# Patient Record
Sex: Male | Born: 1980 | Race: Black or African American | Hispanic: No | Marital: Married | Smoking: Current every day smoker
Health system: Southern US, Community
[De-identification: ages and names within clinical notes are randomized; demographics above are authoritative.]

## PROBLEM LIST (undated history)

## (undated) DIAGNOSIS — E119 Type 2 diabetes mellitus without complications: Secondary | ICD-10-CM

## (undated) DIAGNOSIS — I10 Essential (primary) hypertension: Secondary | ICD-10-CM

## (undated) DIAGNOSIS — R202 Paresthesia of skin: Secondary | ICD-10-CM

## (undated) DIAGNOSIS — R200 Anesthesia of skin: Secondary | ICD-10-CM

## (undated) DIAGNOSIS — F909 Attention-deficit hyperactivity disorder, unspecified type: Secondary | ICD-10-CM

## (undated) HISTORY — PX: NO PAST SURGERIES: SHX2092

---

## 2012-07-21 ENCOUNTER — Encounter (HOSPITAL_COMMUNITY): Payer: Self-pay | Admitting: Emergency Medicine

## 2012-07-21 ENCOUNTER — Inpatient Hospital Stay (HOSPITAL_COMMUNITY): Payer: Self-pay

## 2012-07-21 ENCOUNTER — Observation Stay (HOSPITAL_COMMUNITY)
Admission: EM | Admit: 2012-07-21 | Discharge: 2012-07-22 | Disposition: A | Discharge status: Home / Self Care | Payer: 59 | Attending: Internal Medicine | Admitting: Internal Medicine

## 2012-07-21 DIAGNOSIS — R030 Elevated blood-pressure reading, without diagnosis of hypertension: Secondary | ICD-10-CM | POA: Insufficient documentation

## 2012-07-21 DIAGNOSIS — R0789 Other chest pain: Secondary | ICD-10-CM | POA: Insufficient documentation

## 2012-07-21 DIAGNOSIS — R7309 Other abnormal glucose: Secondary | ICD-10-CM

## 2012-07-21 DIAGNOSIS — R739 Hyperglycemia, unspecified: Secondary | ICD-10-CM | POA: Diagnosis present

## 2012-07-21 DIAGNOSIS — Z720 Tobacco use: Secondary | ICD-10-CM | POA: Diagnosis present

## 2012-07-21 DIAGNOSIS — E119 Type 2 diabetes mellitus without complications: Secondary | ICD-10-CM

## 2012-07-21 DIAGNOSIS — F17200 Nicotine dependence, unspecified, uncomplicated: Secondary | ICD-10-CM

## 2012-07-21 DIAGNOSIS — IMO0001 Reserved for inherently not codable concepts without codable children: Secondary | ICD-10-CM | POA: Diagnosis present

## 2012-07-21 DIAGNOSIS — R209 Unspecified disturbances of skin sensation: Principal | ICD-10-CM | POA: Insufficient documentation

## 2012-07-21 DIAGNOSIS — R202 Paresthesia of skin: Secondary | ICD-10-CM | POA: Diagnosis present

## 2012-07-21 DIAGNOSIS — G459 Transient cerebral ischemic attack, unspecified: Secondary | ICD-10-CM

## 2012-07-21 DIAGNOSIS — R200 Anesthesia of skin: Secondary | ICD-10-CM | POA: Diagnosis present

## 2012-07-21 DIAGNOSIS — I517 Cardiomegaly: Secondary | ICD-10-CM | POA: Diagnosis present

## 2012-07-21 HISTORY — DX: Anesthesia of skin: R20.0

## 2012-07-21 HISTORY — DX: Anesthesia of skin: R20.2

## 2012-07-21 HISTORY — DX: Paresthesia of skin: R20.2

## 2012-07-21 HISTORY — DX: Attention-deficit hyperactivity disorder, unspecified type: F90.9

## 2012-07-21 LAB — CBC WITH DIFFERENTIAL/PLATELET
Basophils Absolute: 0.1 10*3/uL (ref 0.0–0.1)
HCT: 45.8 % (ref 39.0–52.0)
Hemoglobin: 16.2 g/dL (ref 13.0–17.0)
Lymphocytes Relative: 31 % (ref 12–46)
Monocytes Absolute: 0.7 10*3/uL (ref 0.1–1.0)
Monocytes Relative: 6 % (ref 3–12)
Neutro Abs: 6.9 10*3/uL (ref 1.7–7.7)
Neutrophils Relative %: 61 % (ref 43–77)
WBC: 11.4 10*3/uL — ABNORMAL HIGH (ref 4.0–10.5)

## 2012-07-21 LAB — URINALYSIS, ROUTINE W REFLEX MICROSCOPIC
Glucose, UA: >1000 mg/dL — AB
Ketones, ur: NEGATIVE mg/dL
Leukocytes, UA: NEGATIVE
Nitrite: NEGATIVE
Specific Gravity, Urine: 1.042 — ABNORMAL HIGH (ref 1.005–1.030)
pH: 6 (ref 5.0–8.0)

## 2012-07-21 LAB — URINE MICROSCOPIC-ADD ON

## 2012-07-21 LAB — BASIC METABOLIC PANEL
BUN: 8 mg/dL (ref 6–23)
CO2: 26 mEq/L (ref 19–32)
Chloride: 96 mEq/L (ref 96–112)
Creatinine, Ser: 0.74 mg/dL (ref 0.50–1.35)
Potassium: 4.3 mEq/L (ref 3.5–5.1)

## 2012-07-21 LAB — GLUCOSE, CAPILLARY: Glucose-Capillary: 356 mg/dL — ABNORMAL HIGH (ref 70–99)

## 2012-07-21 MED ORDER — SODIUM CHLORIDE 0.9 % IV SOLN
Freq: Once | INTRAVENOUS | Status: AC
  Administered 2012-07-21: 21:00:00 via INTRAVENOUS

## 2012-07-21 MED ORDER — SODIUM CHLORIDE 0.9 % IV BOLUS (SEPSIS)
1000.0000 mL | Freq: Once | INTRAVENOUS | Status: AC
  Administered 2012-07-21: 1000 mL via INTRAVENOUS

## 2012-07-21 MED ORDER — ASPIRIN 81 MG PO CHEW
162.0000 mg | CHEWABLE_TABLET | Freq: Once | ORAL | Status: AC
  Administered 2012-07-21: 162 mg via ORAL
  Filled 2012-07-21: qty 2

## 2012-07-21 MED ORDER — INSULIN ASPART 100 UNIT/ML ~~LOC~~ SOLN
0.0000 [IU] | Freq: Three times a day (TID) | SUBCUTANEOUS | Status: DC
  Administered 2012-07-22: 1 [IU] via SUBCUTANEOUS
  Administered 2012-07-22: 8 [IU] via SUBCUTANEOUS
  Administered 2012-07-22: 5 [IU] via SUBCUTANEOUS

## 2012-07-21 MED ORDER — INSULIN ASPART 100 UNIT/ML ~~LOC~~ SOLN
5.0000 [IU] | Freq: Once | SUBCUTANEOUS | Status: AC
  Administered 2012-07-21: 5 [IU] via SUBCUTANEOUS
  Filled 2012-07-21: qty 1

## 2012-07-21 NOTE — ED Notes (Signed)
Pt c/o numbness in right hand upon waking this am; pt sts increase in thirst and urinating x several month; pt wants to be checked for DM

## 2012-07-21 NOTE — ED Notes (Signed)
Pt. Reports waking up with numbness/tingling in right hand. States he went to work, but numbness did not subside so he came to the ED. States does not feel numbness currently. Radial pulse strong and cap refill instant.

## 2012-07-21 NOTE — Progress Notes (Signed)
Patient admitted to floor from ED. Patient A&Ox3. MD to floor to write orders on patient. Dr. Eilene Ghazi stated trying to r/o TIA. Explained to patient that he would need to go to neuro floor. Waiting for bed assignment.Will continue to monitor Nelda Marseille, RN

## 2012-07-21 NOTE — ED Notes (Signed)
Wait time discussed 

## 2012-07-21 NOTE — ED Notes (Signed)
The pts sugar remains high.  Lab has been called to draw this pts blood

## 2012-07-21 NOTE — H&P (Signed)
Johnathan Stafford is an 31 y.o. male.   Patient was seen and examined on July 21, 2012 at 11:53 PM. PCP - none. Chief Complaint: Right hand finger numbness.  HPI:  31 year old male with no significant past medical history presented to the ER with complaints of persistent numbness in his fingers the right hand. The numbness started off waking up in the morning and persisted through the day. Since it was not getting better he came to the ER. Patient did not have any focal deficits, blurred vision, difficulty speaking or swallowing. Over the last few days patient has noticed off and on dizzy spells and increasing urination. In the ER CT head was negative. Patient's blood sugar was found to be high but not in DKA. Patient has been admitted for further management. Patient at this time denies any chest pain or shortness of breath nausea vomiting abdominal pain. 3-4 days ago patient had a small pin pricking sensation in his right side of chest which happened 2 or 3 times.  Past Medical History  Diagnosis Date  . No pertinent past medical history     Past Surgical History  Procedure Date  . No past surgeries     Family History  Problem Relation Age of Onset  . Diabetes type II Mother   . Coronary artery disease Mother   . Hyperthyroidism Father    Social History:  reports that he has been smoking.  He does not have any smokeless tobacco history on file. He reports that he drinks alcohol. He reports that he does not use illicit drugs.  Allergies: No Known Allergies  No prescriptions prior to admission    Results for orders placed during the hospital encounter of 07/21/12 (from the past 48 hour(s))  GLUCOSE, CAPILLARY     Status: Abnormal   Collection Time   07/21/12  1:40 PM      Component Value Range Comment   Glucose-Capillary 436 (*) 70 - 99 mg/dL    Comment 1 Notify RN      Comment 2 Documented in Chart     GLUCOSE, CAPILLARY     Status: Abnormal   Collection Time   07/21/12  6:51 PM    Component Value Range Comment   Glucose-Capillary 304 (*) 70 - 99 mg/dL   CBC WITH DIFFERENTIAL     Status: Abnormal   Collection Time   07/21/12  7:06 PM      Component Value Range Comment   WBC 11.4 (*) 4.0 - 10.5 K/uL    RBC 4.82  4.22 - 5.81 MIL/uL    Hemoglobin 16.2  13.0 - 17.0 g/dL    HCT 40.9  81.1 - 91.4 %    MCV 95.0  78.0 - 100.0 fL    MCH 33.6  26.0 - 34.0 pg    MCHC 35.4  30.0 - 36.0 g/dL    RDW 78.2  95.6 - 21.3 %    Platelets 262  150 - 400 K/uL    Neutrophils Relative 61  43 - 77 %    Neutro Abs 6.9  1.7 - 7.7 K/uL    Lymphocytes Relative 31  12 - 46 %    Lymphs Abs 3.5  0.7 - 4.0 K/uL    Monocytes Relative 6  3 - 12 %    Monocytes Absolute 0.7  0.1 - 1.0 K/uL    Eosinophils Relative 2  0 - 5 %    Eosinophils Absolute 0.2  0.0 - 0.7 K/uL  Basophils Relative 1  0 - 1 %    Basophils Absolute 0.1  0.0 - 0.1 K/uL   BASIC METABOLIC PANEL     Status: Abnormal   Collection Time   07/21/12  7:06 PM      Component Value Range Comment   Sodium 133 (*) 135 - 145 mEq/L    Potassium 4.3  3.5 - 5.1 mEq/L HEMOLYSIS AT THIS LEVEL MAY AFFECT RESULT   Chloride 96  96 - 112 mEq/L    CO2 26  19 - 32 mEq/L    Glucose, Bld 365 (*) 70 - 99 mg/dL    BUN 8  6 - 23 mg/dL    Creatinine, Ser 1.61  0.50 - 1.35 mg/dL    Calcium 9.7  8.4 - 09.6 mg/dL    GFR calc non Af Amer >90  >90 mL/min    GFR calc Af Amer >90  >90 mL/min   URINALYSIS, ROUTINE W REFLEX MICROSCOPIC     Status: Abnormal   Collection Time   07/21/12  7:47 PM      Component Value Range Comment   Color, Urine YELLOW  YELLOW    APPearance CLEAR  CLEAR    Specific Gravity, Urine 1.042 (*) 1.005 - 1.030    pH 6.0  5.0 - 8.0    Glucose, UA >1000 (*) NEGATIVE mg/dL    Hgb urine dipstick NEGATIVE  NEGATIVE    Bilirubin Urine NEGATIVE  NEGATIVE    Ketones, ur NEGATIVE  NEGATIVE mg/dL    Protein, ur NEGATIVE  NEGATIVE mg/dL    Urobilinogen, UA 1.0  0.0 - 1.0 mg/dL    Nitrite NEGATIVE  NEGATIVE    Leukocytes, UA NEGATIVE   NEGATIVE   URINE MICROSCOPIC-ADD ON     Status: Normal   Collection Time   07/21/12  7:47 PM      Component Value Range Comment   Squamous Epithelial / LPF RARE  RARE    RBC / HPF 0-2  <3 RBC/hpf   GLUCOSE, CAPILLARY     Status: Abnormal   Collection Time   07/21/12  9:23 PM      Component Value Range Comment   Glucose-Capillary 356 (*) 70 - 99 mg/dL   GLUCOSE, CAPILLARY     Status: Abnormal   Collection Time   07/21/12 11:14 PM      Component Value Range Comment   Glucose-Capillary 222 (*) 70 - 99 mg/dL    Ct Head Wo Contrast  07/21/2012  *RADIOLOGY REPORT*  Clinical Data: Right hand numbness.  CT HEAD WITHOUT CONTRAST  Technique:  Contiguous axial images were obtained from the base of the skull through the vertex without contrast.  Comparison: None.  Findings: The ventricles and sulci are symmetrical without significant effacement, displacement, or dilatation. No mass effect or midline shift. No abnormal extra-axial fluid collections. The grey-white matter junction is distinct. Basal cisterns are not effaced. No acute intracranial hemorrhage. No depressed skull fractures.  Visualized paranasal sinuses and mastoid air cells are not opacified.  IMPRESSION: No acute intracranial abnormalities.  Original Report Authenticated By: Marlon Pel, M.D.    Review of Systems  Constitutional: Negative.   HENT: Negative.   Eyes: Negative.   Respiratory: Negative.   Cardiovascular: Negative.   Gastrointestinal: Negative.   Genitourinary: Negative.   Musculoskeletal: Negative.   Skin: Negative.   Neurological: Positive for dizziness and tingling (and numbness of right four fingers.).  Endo/Heme/Allergies: Negative.   Psychiatric/Behavioral: Negative.  Blood pressure 141/92, pulse 76, temperature 98.1 F (36.7 C), temperature source Oral, resp. rate 16, height 6\' 1"  (1.854 m), weight 92.9 kg (204 lb 12.9 oz), SpO2 95.00%. Physical Exam  Constitutional: He is oriented to person, place, and  time. He appears well-developed and well-nourished. No distress.  HENT:  Head: Normocephalic and atraumatic.  Right Ear: External ear normal.  Left Ear: External ear normal.  Nose: Nose normal.  Mouth/Throat: Oropharynx is clear and moist. No oropharyngeal exudate.  Eyes: Conjunctivae are normal. Pupils are equal, round, and reactive to light. Right eye exhibits no discharge. Left eye exhibits no discharge. No scleral icterus.  Neck: Normal range of motion. Neck supple.  Cardiovascular: Normal rate and regular rhythm.   Respiratory: Effort normal and breath sounds normal. No respiratory distress. He has no wheezes. He has no rales.  GI: Soft. Bowel sounds are normal. He exhibits no distension. There is no tenderness. There is no rebound.  Musculoskeletal: Normal range of motion. He exhibits no edema and no tenderness.  Neurological: He is alert and oriented to person, place, and time.       Moves all extremities 5/5. No facial asymmetry. No tongue deviation.  Skin: Skin is warm and dry. He is not diaphoretic.  Psychiatric: His behavior is normal.     Assessment/Plan  #1. Right hand finger numbness - I discussed with on-call neurologist Dr. Eilleen Kempf. Dr.Parekh has requested to call them back in for consult if MRI is positive for any stroke. Patient will be placed on neurochecks, swallow evaluation, MRI MRA brain, carotid Doppler and 2-D echo has been ordered. #2. Hyperglycemia - given his high level of blood sugar patient probably has new onset diabetes mellitus2. At this time we will continue hydration and patient will be kept on sliding-scale coverage. Check hemoglobin A1c. Eventually patient may be discharged on metformin and creatinine is normal. #3. Atypical chest pain - present chest pain-free. Check cardiac markers, EKG and chest x-ray, 2-D echo. #4. Elevated blood pressure - closely follow blood pressure trends in the hospital to see if patient requests definite antihypertensives. #5.  Tobacco abuse - strongly advised to quit smoking.  CODE STATUS - full code.  Ardell Makarewicz N. 07/21/2012, 11:53 PM

## 2012-07-21 NOTE — ED Notes (Signed)
Pt advised of the wait.  He next to go back

## 2012-07-22 ENCOUNTER — Encounter (HOSPITAL_COMMUNITY): Payer: Self-pay | Admitting: General Practice

## 2012-07-22 ENCOUNTER — Observation Stay (HOSPITAL_COMMUNITY): Payer: Self-pay

## 2012-07-22 DIAGNOSIS — I517 Cardiomegaly: Secondary | ICD-10-CM

## 2012-07-22 DIAGNOSIS — E119 Type 2 diabetes mellitus without complications: Secondary | ICD-10-CM

## 2012-07-22 DIAGNOSIS — G459 Transient cerebral ischemic attack, unspecified: Secondary | ICD-10-CM

## 2012-07-22 LAB — HEMOGLOBIN A1C
Hgb A1c MFr Bld: 11.1 % — ABNORMAL HIGH (ref <5.7)
Mean Plasma Glucose: 272 mg/dL — ABNORMAL HIGH (ref <117)

## 2012-07-22 LAB — CARDIAC PANEL(CRET KIN+CKTOT+MB+TROPI)
Relative Index: 0.8 (ref 0.0–2.5)
Relative Index: 0.8 (ref 0.0–2.5)
Total CK: 170 U/L (ref 7–232)
Troponin I: <0.3 ng/mL (ref <0.30)

## 2012-07-22 LAB — CBC WITH DIFFERENTIAL/PLATELET
Basophils Absolute: 0 10*3/uL (ref 0.0–0.1)
Basophils Relative: 0 % (ref 0–1)
Eosinophils Relative: 2 % (ref 0–5)
Lymphocytes Relative: 34 % (ref 12–46)
MCHC: 33.7 g/dL (ref 30.0–36.0)
MCV: 96.3 fL (ref 78.0–100.0)
Platelets: 230 10*3/uL (ref 150–400)
RDW: 11.8 % (ref 11.5–15.5)
WBC: 9.9 10*3/uL (ref 4.0–10.5)

## 2012-07-22 LAB — COMPREHENSIVE METABOLIC PANEL
ALT: 46 U/L (ref 0–53)
AST: 24 U/L (ref 0–37)
Albumin: 3.4 g/dL — ABNORMAL LOW (ref 3.5–5.2)
Alkaline Phosphatase: 70 U/L (ref 39–117)
Potassium: 3.6 mEq/L (ref 3.5–5.1)
Sodium: 137 mEq/L (ref 135–145)
Total Protein: 6.4 g/dL (ref 6.0–8.3)

## 2012-07-22 LAB — RAPID URINE DRUG SCREEN, HOSP PERFORMED
Amphetamines: NOT DETECTED
Barbiturates: NOT DETECTED
Benzodiazepines: NOT DETECTED
Cocaine: NOT DETECTED

## 2012-07-22 LAB — GLUCOSE, CAPILLARY: Glucose-Capillary: 305 mg/dL — ABNORMAL HIGH (ref 70–99)

## 2012-07-22 LAB — LIPID PANEL
Total CHOL/HDL Ratio: 6.1 RATIO
VLDL: 38 mg/dL (ref 0–40)

## 2012-07-22 LAB — TSH: TSH: 0.877 u[IU]/mL (ref 0.350–4.500)

## 2012-07-22 MED ORDER — LIVING WELL WITH DIABETES BOOK
Freq: Once | Status: DC
  Filled 2012-07-22: qty 1

## 2012-07-22 MED ORDER — HYDRALAZINE HCL 20 MG/ML IJ SOLN
10.0000 mg | INTRAMUSCULAR | Status: DC | PRN
  Filled 2012-07-22: qty 0.5

## 2012-07-22 MED ORDER — LIVING WELL WITH DIABETES BOOK: 1.0000 | Freq: Once | Status: DC

## 2012-07-22 MED ORDER — ASPIRIN 325 MG PO TABS
325.0000 mg | ORAL_TABLET | Freq: Every day | ORAL | Status: DC
  Administered 2012-07-22: 325 mg via ORAL
  Filled 2012-07-22 (×2): qty 1

## 2012-07-22 MED ORDER — LISINOPRIL 10 MG PO TABS: 10.0000 mg | ORAL_TABLET | Freq: Every day | ORAL | Status: DC

## 2012-07-22 MED ORDER — METFORMIN HCL 500 MG PO TABS
500.0000 mg | ORAL_TABLET | Freq: Two times a day (BID) | ORAL | Status: DC
  Administered 2012-07-22: 500 mg via ORAL
  Filled 2012-07-22 (×3): qty 1

## 2012-07-22 MED ORDER — METFORMIN HCL 500 MG PO TABS: 500.0000 mg | ORAL_TABLET | Freq: Two times a day (BID) | ORAL | Status: DC

## 2012-07-22 MED ORDER — METFORMIN HCL 1000 MG PO TABS: 1000.0000 mg | ORAL_TABLET | Freq: Two times a day (BID) | ORAL | Status: DC

## 2012-07-22 MED ORDER — SODIUM CHLORIDE 0.9 % IV SOLN
INTRAVENOUS | Status: DC
  Administered 2012-07-22 (×2): via INTRAVENOUS

## 2012-07-22 NOTE — ED Provider Notes (Signed)
History     CSN: [id number]  Arrival date & time 07/21/12  1321   First MD Initiated Contact with Patient 07/21/12 1947      Chief Complaint  Patient presents with  . Numbness    (Consider location/radiation/quality/duration/timing/severity/associated sxs/prior treatment) HPI Comments: Pt with no medical hx comes in with cc of numbness in the right hand. Pt states that he woke up with the numbness, described as tingling, and the numbness never got better, so he came to the ED at 1 pm. numbness resolved while he was in the waiting room. Pt also noted to have high blood sugar. He has no hx of diabetes. Pt has no medical problems that are known and he has no hx of strokes, tia or any other neurologic deficits.  The history is provided by the patient.    Past Medical History  Diagnosis Date  . ADHD (attention deficit hyperactivity disorder)     "all the way up to 9th grade"  . Numbness and tingling in right hand 07/21/2012    resolved at 2355 07/21/12    Past Surgical History  Procedure Date  . No past surgeries     Family History  Problem Relation Age of Onset  . Diabetes type II Mother   . Coronary artery disease Mother   . Hyperthyroidism Father     History  Substance Use Topics  . Smoking status: Current Everyday Smoker -- 1.0 packs/day for 13 years    Types: Cigarettes  . Smokeless tobacco: Never Used  . Alcohol Use: Yes     07/21/12 "I do alcohol maybe twice a month; if that"      Review of Systems  Constitutional: Negative for activity change and appetite change.  Respiratory: Negative for cough and shortness of breath.   Cardiovascular: Negative for chest pain.  Gastrointestinal: Negative for abdominal pain.  Genitourinary: Negative for dysuria.    Allergies  Review of patient's allergies indicates no known allergies.  Home Medications  No current outpatient prescriptions on file.  BP 138/9  Pulse 64  Temp 98.1 F (36.7 C) (Oral)  Resp 18  Ht 6'  1" (1.854 m)  Wt 204 lb 12.9 oz (92.9 kg)  BMI 27.02 kg/m2  SpO2 98%  Physical Exam  Vitals reviewed. Constitutional: He is oriented to person, place, and time. He appears well-developed and well-nourished.  HENT:  Head: Normocephalic and atraumatic.  Eyes: Conjunctivae and EOM are normal. Pupils are equal, round, and reactive to light.  Neck: Normal range of motion. Neck supple. No JVD present.  Cardiovascular: Normal rate and regular rhythm.   Pulmonary/Chest: Effort normal and breath sounds normal. No respiratory distress. He has no wheezes.  Abdominal: Soft. Bowel sounds are normal. He exhibits no distension. There is no tenderness. There is no rebound and no guarding.  Neurological: He is alert and oriented to person, place, and time. He displays normal reflexes. No cranial nerve deficit. Coordination normal.       NIHSS - 0 No objective sensory deficits, Motor strength upper and lower extremity 4+ and equal Normal cerebellar exam  Skin: Skin is warm and dry.    ED Course  Procedures (including critical care time)  Labs Reviewed  GLUCOSE, CAPILLARY - Abnormal; Notable for the following:    Glucose-Capillary 436 (*)     All other components within normal limits  CBC WITH DIFFERENTIAL - Abnormal; Notable for the following:    WBC 11.4 (*)     All other components  within normal limits  BASIC METABOLIC PANEL - Abnormal; Notable for the following:    Sodium 133 (*)     Glucose, Bld 365 (*)     All other components within normal limits  URINALYSIS, ROUTINE W REFLEX MICROSCOPIC - Abnormal; Notable for the following:    Specific Gravity, Urine 1.042 (*)     Glucose, UA >1000 (*)     All other components within normal limits  GLUCOSE, CAPILLARY - Abnormal; Notable for the following:    Glucose-Capillary 304 (*)     All other components within normal limits  GLUCOSE, CAPILLARY - Abnormal; Notable for the following:    Glucose-Capillary 356 (*)     All other components within  normal limits  GLUCOSE, CAPILLARY - Abnormal; Notable for the following:    Glucose-Capillary 222 (*)     All other components within normal limits  URINE MICROSCOPIC-ADD ON  CARDIAC PANEL(CRET KIN+CKTOT+MB+TROPI)  HEMOGLOBIN A1C  COMPREHENSIVE METABOLIC PANEL  CARDIAC PANEL(CRET KIN+CKTOT+MB+TROPI)  CARDIAC PANEL(CRET KIN+CKTOT+MB+TROPI)  URINE RAPID DRUG SCREEN (HOSP PERFORMED)  CBC WITH DIFFERENTIAL  TSH  GLUCOSE, POCT (MANUAL RESULT ENTRY)  GLUCOSE, POCT (MANUAL RESULT ENTRY)  GLUCOSE, POCT (MANUAL RESULT ENTRY)  GLUCOSE, POCT (MANUAL RESULT ENTRY)  URINE RAPID DRUG SCREEN (HOSP PERFORMED)  LIPID PANEL   Ct Head Wo Contrast  07/21/2012  *RADIOLOGY REPORT*  Clinical Data: Right hand numbness.  CT HEAD WITHOUT CONTRAST  Technique:  Contiguous axial images were obtained from the base of the skull through the vertex without contrast.  Comparison: None.  Findings: The ventricles and sulci are symmetrical without significant effacement, displacement, or dilatation. No mass effect or midline shift. No abnormal extra-axial fluid collections. The grey-white matter junction is distinct. Basal cisterns are not effaced. No acute intracranial hemorrhage. No depressed skull fractures.  Visualized paranasal sinuses and mastoid air cells are not opacified.  IMPRESSION: No acute intracranial abnormalities.  Original Report Authenticated By: Marlon Pel, M.D.     1. Diabetes mellitus, new onset   2. TIA (transient ischemic attack)   3. Elevated blood pressure   4. Hyperglycemia   5. Numbness and tingling in right hand   6. Tobacco abuse       MDM  Pt with no medical hx comes in with what appears to be new onset diabetes. Pt has no pcp, and we will admit for diabetes medication titration. Pt also has some right hand numbness. ABCD2 score, if this is a TIA is 3 - however, concerns for this being TIa is low, and this is possibly some neuropathy.         Derwood Kaplan, MD 07/22/12  [phone]

## 2012-07-22 NOTE — Discharge Summary (Signed)
Physician Discharge Summary  Waco Foerster ZOX:[id number] DOB: 07/13/1981 DOA: 07/21/2012  PCP: Sheila Oats, MD  Admit date: 07/21/2012 Discharge date: 07/22/2012  Recommendations for Outpatient Follow-up:  Patient is going to establish a primary care physician upon discharge. Will call patient tomorrow with results of MRI. Patient is being referred for outpatient diabetes education  Discharge Diagnoses:  Principal Problem:  *Numbness and tingling in right hand Active Problems:  Hyperglycemia  Elevated blood pressure  Tobacco abuse  LVH (left ventricular hypertrophy)   Discharge Condition: Improved, being discharged home  Diet recommendation: Carb modified, low sodium  Wt Readings from Last 3 Encounters:  07/21/12 92.9 kg (204 lb 12.9 oz)    History of present illness:  Patient is a 31 year old Afro-American male no past medical history who presents to the emergency room complaining of some right arm and forearm numbness and tingling. His symptoms were persistent and he was concerned. In the emergency room he was evaluated and found to have significant hyperglycemia with CBGs in the 300s and it was found he had underlying diabetes mellitus.  Hospital Course:  1 numbness and tingling of right upper extremity: Initially there was a concern the patient had a TIA given new diagnosed diabetes, blood pressure and mild hyperlipidemia, however his symptoms have resulted in the persistent past 24 hours. More likely some Ambien irritation nerves do to excessive work strain. Regardless with rest of stroke workup being negative, we'll plan to discharge home after MRI is complete and focal for results. He is currently asymptomatic from this.  2. Uncontrolled diabetes mellitus-new diagnosis. Patient received some education here and is being counseled. His A1c came back at 11.1 consistent with an average blood sugar of 272. Start the patient on metformin 1000 twice a day (5 as he was in the  hospital. Also referred for outpatient diabetes education. His mom has diabetes also he was motivated to stay on top of this. We'll also refer him to an ophthalmologist.  3. Elevated blood pressure readings. Initially the plan was given his blood pressure readings ranging from the 140s to 150s to follow up with PCP for further checks, however his echocardiogram does note early signs of left ventricular hypertrophy consistent with likely years of underlying high blood pressure. Have started him on lisinopril 10 mg by mouth daily. He can followup with his new PCP to ascertain if this is working for him.  4. Tobacco abuse. Patient counseled.  Procedures:  Carotid Dopplers-no evidence of any carotid artery stenosis  Echocardiogram 2-D: Preserved ejection fraction however noting early signs of left ventricular hypertrophy  CT scan of the head which was normal.  MRI/MRA: Results are pending  Consultations:  None  Discharge Exam: Filed Vitals:   07/22/12 1451  BP: 134/85  Pulse: 67  Temp: 98.2 F (36.8 C)  Resp: 20   Filed Vitals:   07/22/12 0241 07/22/12 0604 07/22/12 0944 07/22/12 1451  BP: 129/82 136/80 148/86 134/85  Pulse: 68 65 64 67  Temp: 98.3 F (36.8 C) 98.3 F (36.8 C) 98.3 F (36.8 C) 98.2 F (36.8 C)  TempSrc: Oral Oral Oral Oral  Resp: 20 20 20 20   Height:      Weight:      SpO2: 100% 100% 100% 100%   General: Alert and oriented x3, no acute distress, looks older than stated age, HEENT: Normocephalic management, mucous her meds are moist, INR 2 through 12 are intact Neck: Supple no JVD or thyromegaly Cardio vascular: Regular rate and rhythm, S1-S2 Lungs:  Clear to auscultation bilaterally Abdomen: Soft, nontender, nondistended, positive bowel sounds [place] skeletal: No clubbing or cyanosis or edema Neurological: No focal deficits Psychiatric: No evidence of acute psychoses, patient appropriate  Discharge Instructions  Discharge Orders    Future Orders  Please Complete By Expires   Diet - low sodium heart healthy      Diet Carb Modified      Increase activity slowly      Consult diabetes OP education        Medication List  As of 07/22/2012  5:42 PM   TAKE these medications         lisinopril 10 MG tablet   Commonly known as: PRINIVIL,ZESTRIL   Take 1 tablet (10 mg total) by mouth daily.      living well with diabetes book Misc   1 each by Does not apply route once.      metFORMIN 1000 MG tablet   Commonly known as: GLUCOPHAGE   Take 1 tablet (1000 mg total) by mouth 2 (two) times daily with a meal.              The results of significant diagnostics from this hospitalization (including imaging, microbiology, ancillary and laboratory) are listed below for reference.    Significant Diagnostic Studies: Ct Head Wo Contrast  07/21/2012  IMPRESSION: No acute intracranial abnormalities.  Original Report Authenticated By: Marlon Pel, M.D.   Dg Chest Port 1 View  07/22/2012   IMPRESSION: Decreased lung volumes with basilar atelectasis.  No definite acute cardiopulmonary disease on this AP portable examination.  Further evaluation with a PA and lateral chest radiograph may be obtained as clinically indicated.  Original Report Authenticated By: Waynard Reeds, M.D.    Labs: Basic Metabolic Panel:  Lab 07/22/12 8295 07/21/12 1906  NA 137 133*  K 3.6 4.3  CL 101 96  CO2 27 26  GLUCOSE 325* 365*  BUN 7 8  CREATININE 0.81 0.74  CALCIUM 8.6 9.7  MG -- --  PHOS -- --   Liver Function Tests:  Lab 07/22/12 0550  AST 24  ALT 46  ALKPHOS 70  BILITOT 1.1  PROT 6.4  ALBUMIN 3.4*   CBC:  Lab 07/22/12 0550 07/21/12 1906  WBC 9.9 11.4*  NEUTROABS 5.6 6.9  HGB 13.9 16.2  HCT 41.2 45.8  MCV 96.3 95.0  PLT 230 262   Cardiac Enzymes:  Lab 07/22/12 1612 07/22/12 0753 07/22/12 0010  CKTOTAL 201 170 177  CKMB 1.7 1.4 1.5  CKMBINDEX -- -- --  TROPONINI <0.30 <0.30 <0.30   CBG:  Lab 07/22/12 1205 07/22/12 0711  07/21/12 2314 07/21/12 2230 07/21/12 2123  GLUCAP 125* 305* 222* 314* 356*    Time coordinating discharge: 50 minutes  Signed:  Hollice Espy  [facility] 07/22/2012, 5:42 PM

## 2012-07-22 NOTE — Progress Notes (Signed)
Pt will be discharged after MRI completed.  Instructions given by Dr. Rito Ehrlich.  Telemetry dc'd. IV saline locked.

## 2012-07-22 NOTE — Progress Notes (Signed)
VASCULAR LAB PRELIMINARY  PRELIMINARY  PRELIMINARY  PRELIMINARY  Carotid duplex completed.    Preliminary report:  Bilateral:  No evidence of hemodynamically significant internal carotid artery stenosis.   Vertebral artery flow is antegrade.     Azriella Mattia, RVS 07/22/2012, 11:47 AM

## 2012-07-22 NOTE — Plan of Care (Signed)
Problem: Food- and Nutrition-Related Knowledge Deficit (NB-1.1) Goal: Nutrition education Formal process to instruct or train a patient/client in a skill or to impart knowledge to help patients/clients voluntarily manage or modify food choices and eating behavior to maintain or improve health.  Outcome: Completed/Met Date Met:  07/22/12  Recommend add Carbohydrate Modified Medium to current diet order.  RD consulted for nutrition education regarding diabetes.  Pt has been newly Dx with DM.    No results found for this basename: HGBA1C    RD provided "Carbohydrate Counting for People with Diabetes" handout from the Academy of [employer] and Dietetics. Discussed different food groups and their effects on blood sugar, emphasizing carbohydrate-containing foods. Provided list of carbohydrates and recommended serving sizes of common foods.  Discussed importance of controlled and consistent carbohydrate intake throughout the day. Provided examples of ways to balance meals/snacks and encouraged intake of high-fiber, whole grain complex carbohydrates. Questions answered. We discussed quick and easy meal ideas as well as how to eat out on DM diet.   Expect fair compliance.  Body mass index is 27.02 kg/(m^2). Pt meets criteria for overweight based on current BMI.  Current diet order is Heart Healthy, patient is consuming approximately 100% of meals at this time. Labs and medications reviewed. No further nutrition interventions warranted at this time. If additional nutrition issues arise, please re-consult RD.  Kendell Bane RD, LDN, CNSC [phone] Pager [phone] After Hours Pager

## 2012-07-22 NOTE — Progress Notes (Signed)
Called report to 4N RN Joyce Gross, RN. Patient transferred to 4N-09 with belongings via wheelchair and heart monitor. Told RN that patient needs stroke swallow screen and NIH scale when transfer to there unit. Will continue to monitor patient. Nelda Marseille, RN

## 2012-07-22 NOTE — Progress Notes (Signed)
Utilization review complete 

## 2012-07-22 NOTE — Progress Notes (Signed)
Inpatient Diabetes Program Recommendations  AACE/ADA: New Consensus Statement on Inpatient Glycemic Control (2013)  Target Ranges:  Prepandial:   less than 140 mg/dL      Peak postprandial:   less than 180 mg/dL (1-2 hours)      Critically ill patients:  140 - 180 mg/dL   Reason for Visit: New onset diabetes  Inpatient Diabetes Program Recommendations Insulin - Basal: Please order Lantus 15 units to start (help to rest the Beta cells while here)l Diet: Added carb modified to diet orders.  (Heart healthy diet has more carbohydrate than a regular diet.)  Note: Thank you, Lenor Coffin, RN, CNS, Diabetes Coordinator [phone])

## 2012-07-23 LAB — GLUCOSE, CAPILLARY: Glucose-Capillary: 270 mg/dL — ABNORMAL HIGH (ref 70–99)

## 2012-10-29 ENCOUNTER — Encounter (HOSPITAL_COMMUNITY): Payer: Self-pay | Admitting: Emergency Medicine

## 2012-10-29 ENCOUNTER — Emergency Department (HOSPITAL_COMMUNITY)
Admission: EM | Admit: 2012-10-29 | Discharge: 2012-10-30 | Disposition: A | Discharge status: Home / Self Care | Payer: Self-pay | Attending: Emergency Medicine | Admitting: Emergency Medicine

## 2012-10-29 DIAGNOSIS — R0789 Other chest pain: Secondary | ICD-10-CM | POA: Insufficient documentation

## 2012-10-29 DIAGNOSIS — R739 Hyperglycemia, unspecified: Secondary | ICD-10-CM

## 2012-10-29 DIAGNOSIS — I10 Essential (primary) hypertension: Secondary | ICD-10-CM | POA: Insufficient documentation

## 2012-10-29 DIAGNOSIS — F17200 Nicotine dependence, unspecified, uncomplicated: Secondary | ICD-10-CM | POA: Insufficient documentation

## 2012-10-29 DIAGNOSIS — Z720 Tobacco use: Secondary | ICD-10-CM

## 2012-10-29 DIAGNOSIS — E119 Type 2 diabetes mellitus without complications: Secondary | ICD-10-CM | POA: Insufficient documentation

## 2012-10-29 DIAGNOSIS — F909 Attention-deficit hyperactivity disorder, unspecified type: Secondary | ICD-10-CM | POA: Insufficient documentation

## 2012-10-29 DIAGNOSIS — R7309 Other abnormal glucose: Secondary | ICD-10-CM | POA: Insufficient documentation

## 2012-10-29 DIAGNOSIS — R030 Elevated blood-pressure reading, without diagnosis of hypertension: Secondary | ICD-10-CM | POA: Insufficient documentation

## 2012-10-29 HISTORY — DX: Type 2 diabetes mellitus without complications: E11.9

## 2012-10-29 HISTORY — DX: Essential (primary) hypertension: I10

## 2012-10-29 MED ORDER — SODIUM CHLORIDE 0.9 % IV BOLUS (SEPSIS)
1000.0000 mL | Freq: Once | INTRAVENOUS | Status: AC
  Administered 2012-10-30: 1000 mL via INTRAVENOUS

## 2012-10-29 NOTE — ED Notes (Signed)
Pt. States that this chest pain just started today and has been hurting all day.  Denies N/V/D but rates pain at an 8 out of 10.  Denies SOB, dizziness and lightheadedness.

## 2012-10-30 ENCOUNTER — Emergency Department (HOSPITAL_COMMUNITY): Payer: Self-pay

## 2012-10-30 LAB — POCT I-STAT 3, VENOUS BLOOD GAS (G3P V)
Acid-Base Excess: 3 mmol/L — ABNORMAL HIGH (ref 0.0–2.0)
Bicarbonate: 26.9 mEq/L — ABNORMAL HIGH (ref 20.0–24.0)
pO2, Ven: 46 mmHg — ABNORMAL HIGH (ref 30.0–45.0)

## 2012-10-30 LAB — CBC WITH DIFFERENTIAL/PLATELET
Basophils Relative: 0 % (ref 0–1)
Eosinophils Absolute: 0.2 10*3/uL (ref 0.0–0.7)
MCH: 33.7 pg (ref 26.0–34.0)
MCHC: 34.7 g/dL (ref 30.0–36.0)
Monocytes Relative: 7 % (ref 3–12)
Neutrophils Relative %: 64 % (ref 43–77)
Platelets: 334 10*3/uL (ref 150–400)
RDW: 11.8 % (ref 11.5–15.5)

## 2012-10-30 LAB — COMPREHENSIVE METABOLIC PANEL
Albumin: 4.3 g/dL (ref 3.5–5.2)
Alkaline Phosphatase: 60 U/L (ref 39–117)
BUN: 11 mg/dL (ref 6–23)
Potassium: 4.1 mEq/L (ref 3.5–5.1)
Sodium: 134 mEq/L — ABNORMAL LOW (ref 135–145)
Total Protein: 8.1 g/dL (ref 6.0–8.3)

## 2012-10-30 LAB — TROPONIN I: Troponin I: <0.3 ng/mL (ref <0.30)

## 2012-10-30 MED ORDER — GI COCKTAIL ~~LOC~~
30.0000 mL | Freq: Once | ORAL | Status: AC
  Administered 2012-10-30: 30 mL via ORAL
  Filled 2012-10-30: qty 30

## 2012-10-30 MED ORDER — LISINOPRIL 20 MG PO TABS: 10.0000 mg | ORAL_TABLET | Freq: Every day | ORAL | Status: DC

## 2012-10-30 MED ORDER — METFORMIN HCL 1000 MG PO TABS: 1000.0000 mg | ORAL_TABLET | Freq: Two times a day (BID) | ORAL | Status: DC
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
## 2012-12-26 ENCOUNTER — Emergency Department (HOSPITAL_COMMUNITY): Payer: No Typology Code available for payment source

## 2012-12-26 ENCOUNTER — Encounter (HOSPITAL_COMMUNITY): Payer: Self-pay | Admitting: Emergency Medicine

## 2012-12-26 ENCOUNTER — Emergency Department (HOSPITAL_COMMUNITY)
Admission: EM | Admit: 2012-12-26 | Discharge: 2012-12-26 | Disposition: A | Discharge status: Home / Self Care | Payer: No Typology Code available for payment source | Attending: Emergency Medicine | Admitting: Emergency Medicine

## 2012-12-26 DIAGNOSIS — Y92410 Unspecified street and highway as the place of occurrence of the external cause: Secondary | ICD-10-CM | POA: Insufficient documentation

## 2012-12-26 DIAGNOSIS — I10 Essential (primary) hypertension: Secondary | ICD-10-CM | POA: Insufficient documentation

## 2012-12-26 DIAGNOSIS — S79919A Unspecified injury of unspecified hip, initial encounter: Secondary | ICD-10-CM | POA: Insufficient documentation

## 2012-12-26 DIAGNOSIS — Y9389 Activity, other specified: Secondary | ICD-10-CM | POA: Insufficient documentation

## 2012-12-26 DIAGNOSIS — F17200 Nicotine dependence, unspecified, uncomplicated: Secondary | ICD-10-CM | POA: Insufficient documentation

## 2012-12-26 DIAGNOSIS — Z8659 Personal history of other mental and behavioral disorders: Secondary | ICD-10-CM | POA: Insufficient documentation

## 2012-12-26 DIAGNOSIS — S79929A Unspecified injury of unspecified thigh, initial encounter: Secondary | ICD-10-CM | POA: Insufficient documentation

## 2012-12-26 DIAGNOSIS — Z79899 Other long term (current) drug therapy: Secondary | ICD-10-CM | POA: Insufficient documentation

## 2012-12-26 DIAGNOSIS — E119 Type 2 diabetes mellitus without complications: Secondary | ICD-10-CM | POA: Insufficient documentation

## 2012-12-26 MED ORDER — OXYCODONE-ACETAMINOPHEN 5-325 MG PO TABS: 1.0000 | ORAL_TABLET | Freq: Four times a day (QID) | ORAL | Status: AC | PRN

## 2012-12-26 MED ORDER — IBUPROFEN 600 MG PO TABS: 600.0000 mg | ORAL_TABLET | Freq: Four times a day (QID) | ORAL | Status: AC | PRN

## 2012-12-26 MED ORDER — CYCLOBENZAPRINE HCL 10 MG PO TABS: 10.0000 mg | ORAL_TABLET | Freq: Two times a day (BID) | ORAL | Status: AC | PRN
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
## 2012-12-26 NOTE — ED Provider Notes (Signed)
Medical screening examination/treatment/procedure(s) were performed by non-physician practitioner and as supervising physician I was immediately available for consultation/collaboration.  Cyndie Woodbeck K Laurieanne Galloway-Rasch, MD 12/26/12 0332 

## 2012-12-26 NOTE — ED Notes (Signed)
Patient transported to X-ray 

## 2013-01-10 ENCOUNTER — Encounter (HOSPITAL_COMMUNITY): Payer: Self-pay | Admitting: *Deleted

## 2013-01-10 ENCOUNTER — Emergency Department (HOSPITAL_COMMUNITY)
Admission: EM | Admit: 2013-01-10 | Discharge: 2013-01-10 | Disposition: A | Discharge status: Home / Self Care | Payer: No Typology Code available for payment source | Attending: Emergency Medicine | Admitting: Emergency Medicine

## 2013-01-10 ENCOUNTER — Emergency Department (HOSPITAL_COMMUNITY): Payer: No Typology Code available for payment source

## 2013-01-10 DIAGNOSIS — I10 Essential (primary) hypertension: Secondary | ICD-10-CM | POA: Insufficient documentation

## 2013-01-10 DIAGNOSIS — Z79899 Other long term (current) drug therapy: Secondary | ICD-10-CM | POA: Insufficient documentation

## 2013-01-10 DIAGNOSIS — Y939 Activity, unspecified: Secondary | ICD-10-CM | POA: Insufficient documentation

## 2013-01-10 DIAGNOSIS — Y92410 Unspecified street and highway as the place of occurrence of the external cause: Secondary | ICD-10-CM | POA: Insufficient documentation

## 2013-01-10 DIAGNOSIS — M549 Dorsalgia, unspecified: Secondary | ICD-10-CM | POA: Insufficient documentation

## 2013-01-10 DIAGNOSIS — E119 Type 2 diabetes mellitus without complications: Secondary | ICD-10-CM | POA: Insufficient documentation

## 2013-01-10 DIAGNOSIS — Z8659 Personal history of other mental and behavioral disorders: Secondary | ICD-10-CM | POA: Insufficient documentation

## 2013-01-10 DIAGNOSIS — S335XXA Sprain of ligaments of lumbar spine, initial encounter: Secondary | ICD-10-CM | POA: Insufficient documentation

## 2013-01-10 DIAGNOSIS — F17200 Nicotine dependence, unspecified, uncomplicated: Secondary | ICD-10-CM | POA: Insufficient documentation

## 2013-01-10 DIAGNOSIS — S39012A Strain of muscle, fascia and tendon of lower back, initial encounter: Secondary | ICD-10-CM

## 2013-01-10 MED ORDER — ACETAMINOPHEN 500 MG PO TABS
1000.0000 mg | ORAL_TABLET | Freq: Once | ORAL | Status: AC
  Administered 2013-01-10: 1000 mg via ORAL
  Filled 2013-01-10: qty 2

## 2013-01-10 MED ORDER — METFORMIN HCL 1000 MG PO TABS: 1000.0000 mg | ORAL_TABLET | Freq: Every day | ORAL | Status: DC
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
## 2013-01-10 NOTE — ED Notes (Signed)
CBG 169. 

## 2013-01-10 NOTE — ED Notes (Signed)
Reports lower back pain, was seen here recently for same. Also out of diabetes meds for one week.

## 2013-08-20 IMAGING — CT CT HEAD W/O CM
1 of 2 series · 13 of 30 positions shown, 17 images · non-contrast
Comparison: None.

CLINICAL DATA: Right hand numbness.

CT HEAD WITHOUT CONTRAST
TECHNIQUE: Contiguous axial images were obtained from the base of
the skull through the vertex without contrast.

[Series 2: brain · axial · 0.47mm/px · z∈[+98,+228]mm · 13 of 32 slices shown, 17 images]
[im 3/32  brain]
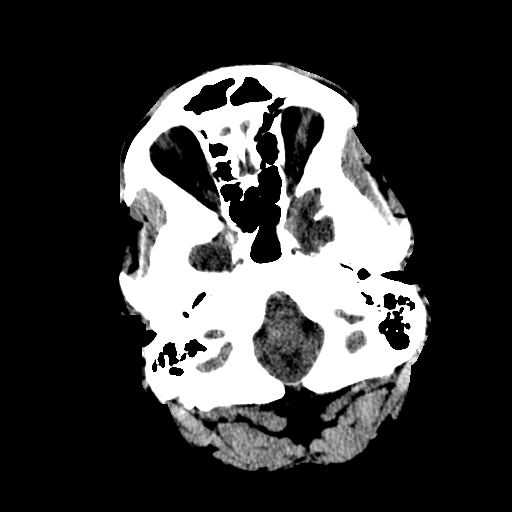
[im 3/32  bone]
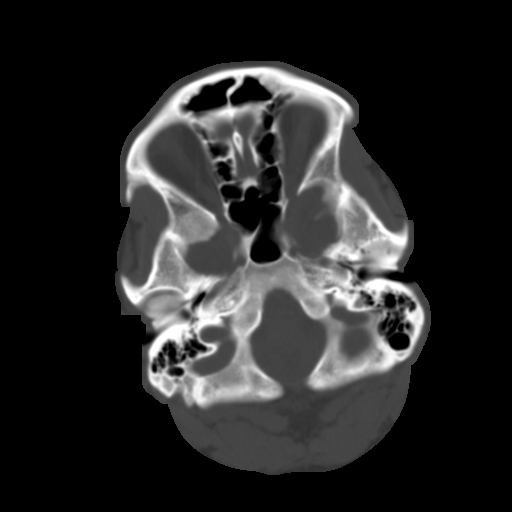
[im 5/32  brain]
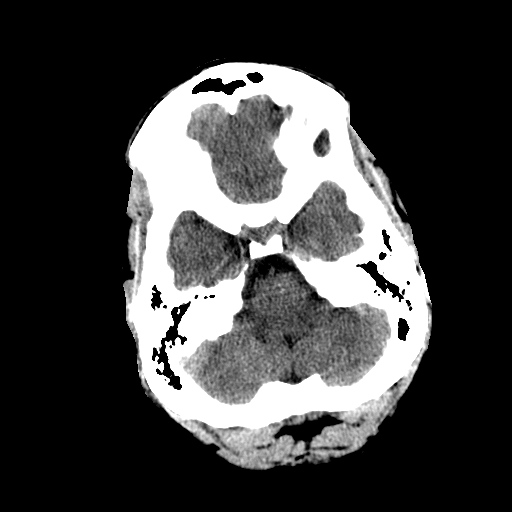
[im 7/32  brain]
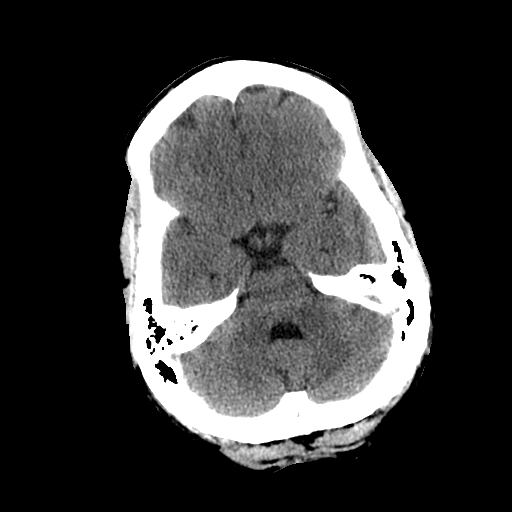
[im 9/32  brain]
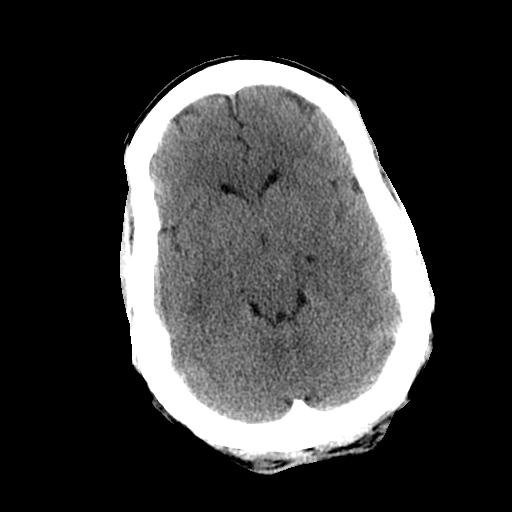
[im 12/32  brain]
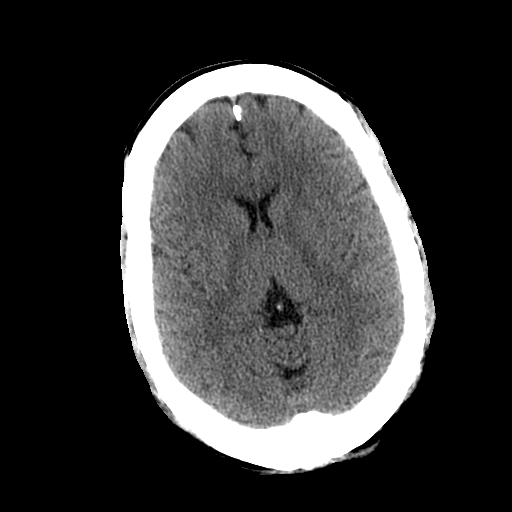
[im 12/32  bone]
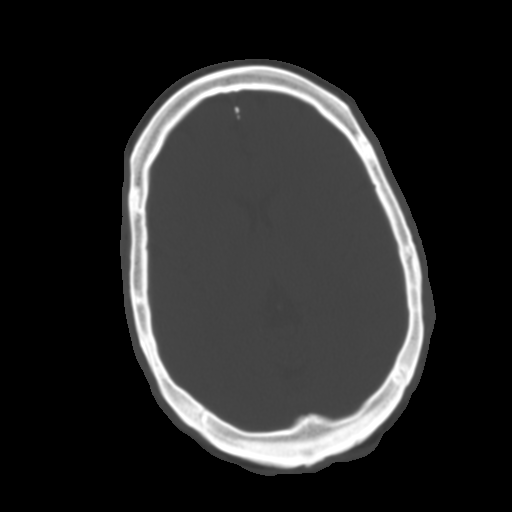
[im 14/32  brain]
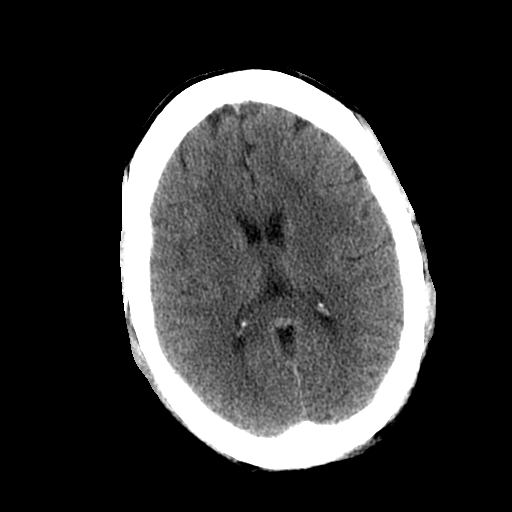
[im 16/32  brain]
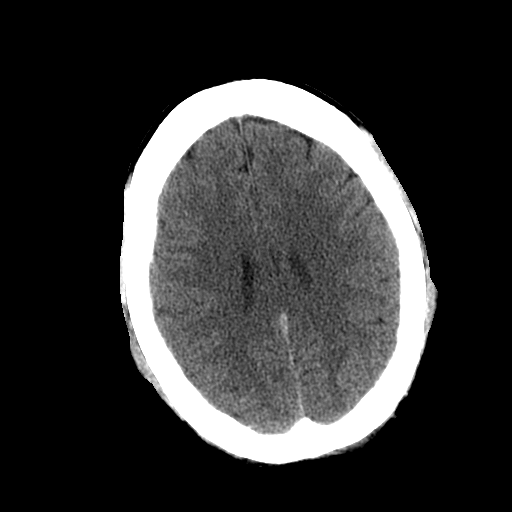
[im 18/32  brain]
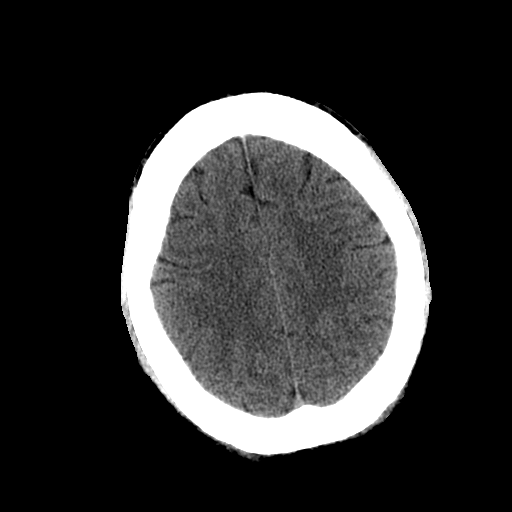
[im 20/32  brain]
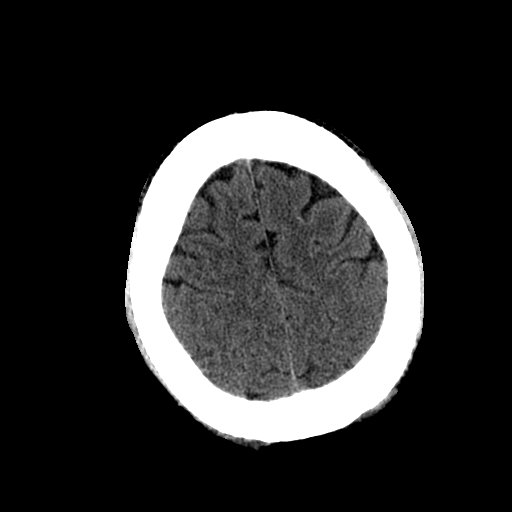
[im 20/32  bone]
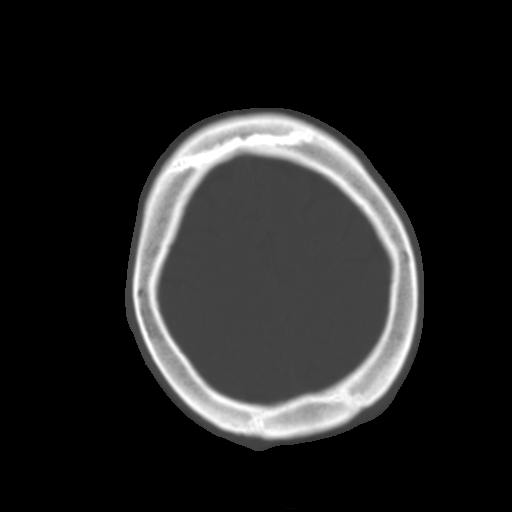
[im 23/32  brain]
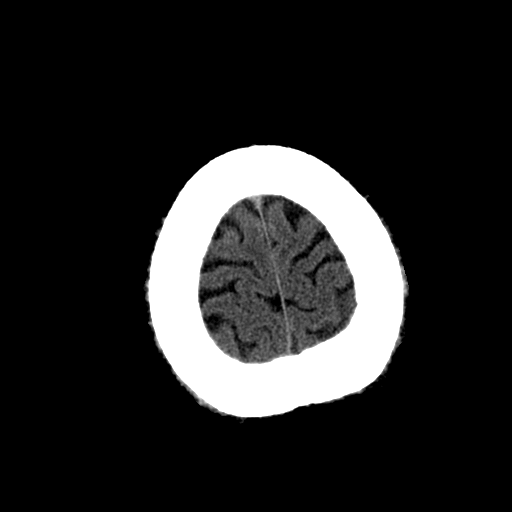
[im 25/32  brain]
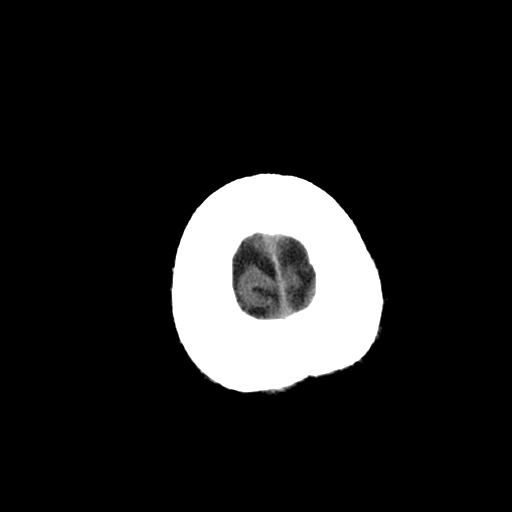
[im 27/32  brain]
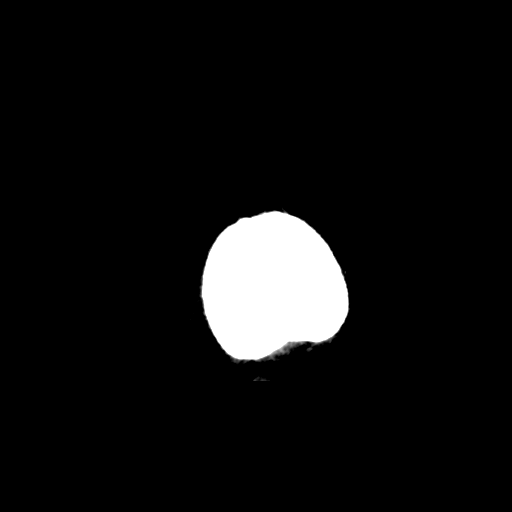
[im 29/32  brain]
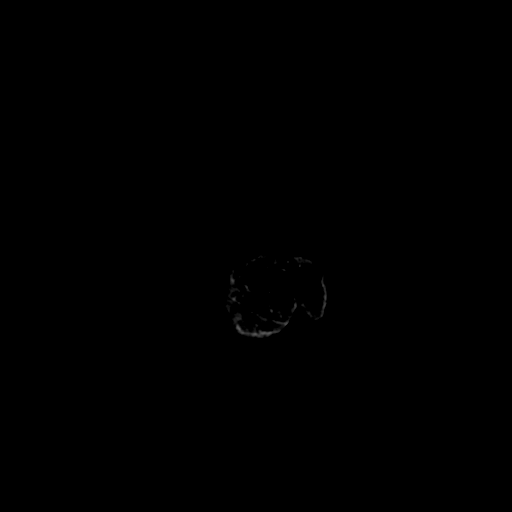
[im 29/32  bone]
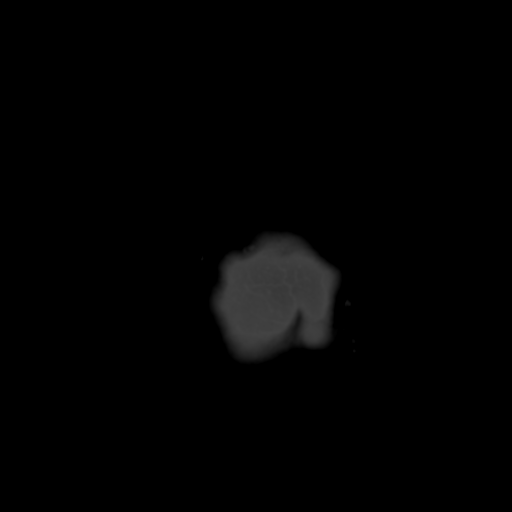

[13 of 30 positions shown; findings below may reference images not displayed]

FINDINGS: The ventricles and sulci are symmetrical without
significant effacement, displacement, or dilatation. No mass effect
or midline shift. No abnormal extra-axial fluid collections. The
grey-white matter junction is distinct. Basal cisterns are not
effaced. No acute intracranial hemorrhage. No depressed skull
fractures.  Visualized paranasal sinuses and mastoid air cells are
not opacified.
IMPRESSION: No acute intracranial abnormalities.

## 2013-08-21 IMAGING — CR DG CHEST 1V PORT
1 series · 1 of 1 positions shown · non-contrast
Comparison: None.

CLINICAL DATA: Evaluate for infiltrates, right arm paresthesias

PORTABLE CHEST - 1 VIEW

[AP]
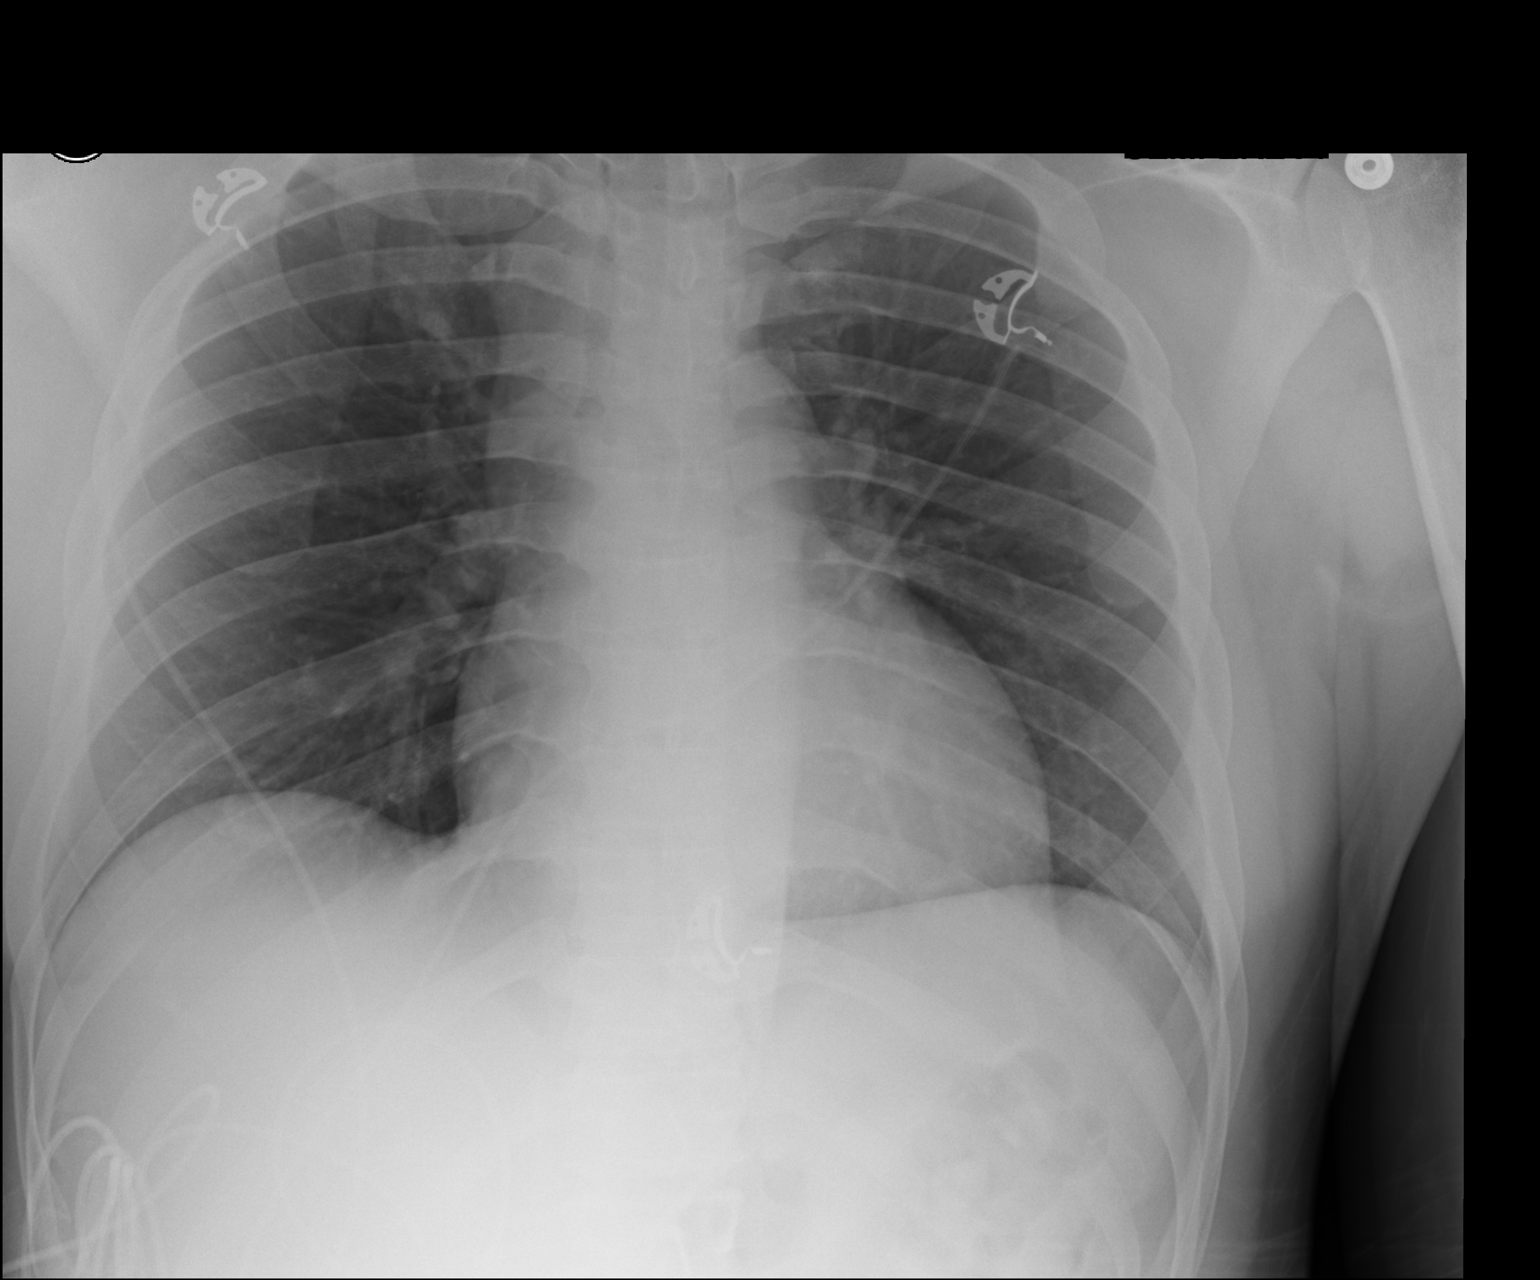

[1 of 1 positions shown; findings below may reference images not displayed]

FINDINGS: Normal cardiac silhouette and mediastinal contours given
decreased lung volumes.  There is mild elevation of the right
hemidiaphragm.  Minimal bibasilar heterogeneous opacities, right
greater than left, likely atelectasis.  No definite pleural
effusion or pneumothorax.  Unchanged bones.
IMPRESSION: Decreased lung volumes with basilar atelectasis.  No definite acute
cardiopulmonary disease on this AP portable examination.  Further
evaluation with a PA and lateral chest radiograph may be obtained
as clinically indicated.

## 2015-10-11 ENCOUNTER — Emergency Department (HOSPITAL_COMMUNITY)
Admission: EM | Admit: 2015-10-11 | Discharge: 2015-10-11 | Disposition: A | Discharge status: Home / Self Care | Payer: Self-pay | Attending: Emergency Medicine | Admitting: Emergency Medicine

## 2015-10-11 ENCOUNTER — Encounter (HOSPITAL_COMMUNITY): Payer: Self-pay | Admitting: Emergency Medicine

## 2015-10-11 DIAGNOSIS — Z760 Encounter for issue of repeat prescription: Secondary | ICD-10-CM | POA: Insufficient documentation

## 2015-10-11 DIAGNOSIS — E1165 Type 2 diabetes mellitus with hyperglycemia: Secondary | ICD-10-CM | POA: Insufficient documentation

## 2015-10-11 DIAGNOSIS — Z720 Tobacco use: Secondary | ICD-10-CM | POA: Insufficient documentation

## 2015-10-11 DIAGNOSIS — Z8659 Personal history of other mental and behavioral disorders: Secondary | ICD-10-CM | POA: Insufficient documentation

## 2015-10-11 DIAGNOSIS — R739 Hyperglycemia, unspecified: Secondary | ICD-10-CM

## 2015-10-11 DIAGNOSIS — I10 Essential (primary) hypertension: Secondary | ICD-10-CM | POA: Insufficient documentation

## 2015-10-11 LAB — CBC WITH DIFFERENTIAL/PLATELET
BASOS PCT: 0 %
Basophils Absolute: 0 10*3/uL (ref 0.0–0.1)
EOS ABS: 0.2 10*3/uL (ref 0.0–0.7)
EOS PCT: 2 %
HCT: 39 % (ref 39.0–52.0)
HEMOGLOBIN: 12.9 g/dL — AB (ref 13.0–17.0)
Lymphocytes Relative: 23 %
Lymphs Abs: 2.7 10*3/uL (ref 0.7–4.0)
MCH: 32.3 pg (ref 26.0–34.0)
MCHC: 33.1 g/dL (ref 30.0–36.0)
MCV: 97.7 fL (ref 78.0–100.0)
MONOS PCT: 6 %
Monocytes Absolute: 0.8 10*3/uL (ref 0.1–1.0)
NEUTROS PCT: 69 %
Neutro Abs: 8.2 10*3/uL — ABNORMAL HIGH (ref 1.7–7.7)
PLATELETS: 272 10*3/uL (ref 150–400)
RBC: 3.99 MIL/uL — ABNORMAL LOW (ref 4.22–5.81)
RDW: 11.5 % (ref 11.5–15.5)
WBC: 11.8 10*3/uL — AB (ref 4.0–10.5)

## 2015-10-11 LAB — I-STAT CHEM 8, ED
BUN: 10 mg/dL (ref 6–20)
CALCIUM ION: 1.19 mmol/L (ref 1.12–1.23)
Chloride: 100 mmol/L — ABNORMAL LOW (ref 101–111)
Creatinine, Ser: 1 mg/dL (ref 0.61–1.24)
Glucose, Bld: 250 mg/dL — ABNORMAL HIGH (ref 65–99)
HEMATOCRIT: 45 % (ref 39.0–52.0)
HEMOGLOBIN: 15.3 g/dL (ref 13.0–17.0)
Potassium: 3.6 mmol/L (ref 3.5–5.1)
SODIUM: 138 mmol/L (ref 135–145)
TCO2: 28 mmol/L (ref 0–100)

## 2015-10-11 LAB — CBG MONITORING, ED: Glucose-Capillary: 168 mg/dL — ABNORMAL HIGH (ref 65–99)

## 2015-10-11 MED ORDER — METFORMIN HCL 1000 MG PO TABS: 1000.0000 mg | ORAL_TABLET | Freq: Two times a day (BID) | ORAL | Status: AC

## 2015-10-11 MED ORDER — LISINOPRIL 10 MG PO TABS: 10.0000 mg | ORAL_TABLET | Freq: Every day | ORAL | Status: AC

## 2015-10-11 NOTE — ED Provider Notes (Signed)
CSN: [id number]     Arrival date & time 10/11/15  1532 History  By signing my name below, I, Lyndel SafeKaitlyn Shelton, attest that this documentation has been prepared under the direction and in the presence of Teressa LowerVrinda Tyrianna Lightle, NP. Electronically Signed: Lyndel SafeKaitlyn Shelton, ED Scribe. 10/11/2015. 5:34 PM.   Chief Complaint  Patient presents with  . Medication Refill   The history is provided by the patient. No language interpreter was used.   HPI Comments: Johnathan Stafford is a 34 y.o. male, with a PMhx of DM and HTN, who presents to the Emergency Department for a medication refill of 1000mg  metformin and 20mg  lisinopril. The pt additionally states while giving plasma earlier today he felt his blood was 'thick' and he wanted to be evaluated for this today. He has no other complaints.   Past Medical History  Diagnosis Date  . ADHD (attention deficit hyperactivity disorder)     "all the way up to 9th grade"  . Numbness and tingling in right hand 07/21/2012    resolved at 2355 07/21/12  . Diabetes mellitus without complication (HCC)   . Hypertension    Past Surgical History  Procedure Laterality Date  . No past surgeries     Family History  Problem Relation Age of Onset  . Diabetes type II Mother   . Coronary artery disease Mother   . Hyperthyroidism Father    Social History  Substance Use Topics  . Smoking status: Current Every Day Smoker -- 1.00 packs/day for 13 years    Types: Cigarettes  . Smokeless tobacco: Never Used  . Alcohol Use: Yes     Comment: 07/21/12 "I do alcohol maybe twice a month; if that"    Review of Systems  Constitutional: Negative for fever.  Allergic/Immunologic: Positive for immunocompromised state.  All other systems reviewed and are negative.  Allergies  Review of patient's allergies indicates no known allergies.  Home Medications   Prior to Admission medications   Medication Sig Start Date End Date Taking? Authorizing Provider  cyclobenzaprine (FLEXERIL) 10  MG tablet Take 1 tablet (10 mg total) by mouth 2 (two) times daily as needed for muscle spasms. 12/26/12   Tiffany Neva SeatGreene, PA-C  ibuprofen (ADVIL,MOTRIN) 600 MG tablet Take 1 tablet (600 mg total) by mouth every 6 (six) hours as needed for pain. 12/26/12   Tiffany Neva SeatGreene, PA-C  lisinopril (PRINIVIL,ZESTRIL) 20 MG tablet Take 0.5 tablets (10 mg total) by mouth daily. 10/30/12   Marisa Severinlga Otter, MD  metFORMIN (GLUCOPHAGE) 1000 MG tablet Take 1 tablet (1,000 mg total) by mouth 2 (two) times daily. 10/30/12   Marisa Severinlga Otter, MD  metFORMIN (GLUCOPHAGE) 1000 MG tablet Take 1 tablet (1,000 mg total) by mouth daily with breakfast. 01/10/13   Doug SouSam Jacubowitz, MD  oxyCODONE-acetaminophen (PERCOCET/ROXICET) 5-325 MG per tablet Take 1 tablet by mouth every 6 (six) hours as needed for pain. 12/26/12   Tiffany Neva SeatGreene, PA-C   BP 127/75 mmHg  Pulse 105  Temp(Src) 98.1 F (36.7 C) (Oral)  Resp 16  Ht 6\' 1"  (1.854 m)  Wt 190 lb (86.183 kg)  BMI 25.07 kg/m2  SpO2 99% Physical Exam  Constitutional: He is oriented to person, place, and time. He appears well-developed and well-nourished. No distress.  HENT:  Head: Normocephalic.  Eyes: Conjunctivae are normal. Pupils are equal, round, and reactive to light.  Neck: Normal range of motion. Neck supple.  Cardiovascular: Normal rate.   Pulmonary/Chest: Effort normal. No respiratory distress.  Musculoskeletal: Normal range of motion.  Neurological: He is alert and oriented to person, place, and time. Coordination normal.  Skin: Skin is warm.  Psychiatric: He has a normal mood and affect. His behavior is normal.  Nursing note and vitals reviewed.  ED Course  Procedures  DIAGNOSTIC STUDIES: Oxygen Saturation is 99% on RA, normal by my interpretation.    COORDINATION OF CARE: 5:44 PM Discussed treatment plan with pt at bedside and pt agreed to plan.  Labs Review Labs Reviewed  CBC WITH DIFFERENTIAL/PLATELET - Abnormal; Notable for the following:    WBC 11.8 (*)    RBC  3.99 (*)    Hemoglobin 12.9 (*)    Neutro Abs 8.2 (*)    All other components within normal limits  CBG MONITORING, ED - Abnormal; Notable for the following:    Glucose-Capillary 168 (*)    All other components within normal limits  I-STAT CHEM 8, ED - Abnormal; Notable for the following:    Chloride 100 (*)    Glucose, Bld 250 (*)    All other components within normal limits  CBG MONITORING, ED  I have personally reviewed and evaluated these lab results as part of my medical decision-making.   MDM   Final diagnoses:  Hyperglycemia  Medication refill   Pt is okay to follow up with pcp. Will refill metformin and lisinopril. Discussed with pt the need to follow up to have A1c etc drawn  I personally performed the services described in this documentation, which was scribed in my presence. The recorded information has been reviewed and is accurate.    Teressa Lower, NP 10/11/15 1816  Cathren Laine, MD 10/11/15 [phone]

## 2015-10-11 NOTE — Discharge Instructions (Signed)
Hyperglycemia °Hyperglycemia occurs when the glucose (sugar) in your blood is too high. Hyperglycemia can happen for many reasons, but it most often happens to people who do not know they have diabetes or are not managing their diabetes properly.  °CAUSES  °Whether you have diabetes or not, there are other causes of hyperglycemia. Hyperglycemia can occur when you have diabetes, but it can also occur in other situations that you might not be as aware of, such as: °Diabetes °· If you have diabetes and are having problems controlling your blood glucose, hyperglycemia could occur because of some of the following reasons: °¨ Not following your meal plan. °¨ Not taking your diabetes medications or not taking it properly. °¨ Exercising less or doing less activity than you normally do. °¨ Being sick. °Pre-diabetes °· This cannot be ignored. Before people develop Type 2 diabetes, they almost always have "pre-diabetes." This is when your blood glucose levels are higher than normal, but not yet high enough to be diagnosed as diabetes. Research has shown that some long-term damage to the body, especially the heart and circulatory system, may already be occurring during pre-diabetes. If you take action to manage your blood glucose when you have pre-diabetes, you may delay or prevent Type 2 diabetes from developing. °Stress °· If you have diabetes, you may be "diet" controlled or on oral medications or insulin to control your diabetes. However, you may find that your blood glucose is higher than usual in the hospital whether you have diabetes or not. This is often referred to as "stress hyperglycemia." Stress can elevate your blood glucose. This happens because of hormones put out by the body during times of stress. If stress has been the cause of your high blood glucose, it can be followed regularly by your caregiver. That way he/she can make sure your hyperglycemia does not continue to get worse or progress to  diabetes. °Steroids °· Steroids are medications that act on the infection fighting system (immune system) to block inflammation or infection. One side effect can be a rise in blood glucose. Most people can produce enough extra insulin to allow for this rise, but for those who cannot, steroids make blood glucose levels go even higher. It is not unusual for steroid treatments to "uncover" diabetes that is developing. It is not always possible to determine if the hyperglycemia will go away after the steroids are stopped. A special blood test called an A1c is sometimes done to determine if your blood glucose was elevated before the steroids were started. °SYMPTOMS °· Thirsty. °· Frequent urination. °· Dry mouth. °· Blurred vision. °· Tired or fatigue. °· Weakness. °· Sleepy. °· Tingling in feet or leg. °DIAGNOSIS  °Diagnosis is made by monitoring blood glucose in one or all of the following ways: °· A1c test. This is a chemical found in your blood. °· Fingerstick blood glucose monitoring. °· Laboratory results. °TREATMENT  °First, knowing the cause of the hyperglycemia is important before the hyperglycemia can be treated. Treatment may include, but is not be limited to: °· Education. °· Change or adjustment in medications. °· Change or adjustment in meal plan. °· Treatment for an illness, infection, etc. °· More frequent blood glucose monitoring. °· Change in exercise plan. °· Decreasing or stopping steroids. °· Lifestyle changes. °HOME CARE INSTRUCTIONS  °· Test your blood glucose as directed. °· Exercise regularly. Your caregiver will give you instructions about exercise. Pre-diabetes or diabetes which comes on with stress is helped by exercising. °· Eat wholesome,   balanced meals. Eat often and at regular, fixed times. Your caregiver or nutritionist will give you a meal plan to guide your sugar intake. °· Being at an ideal weight is important. If needed, losing as little as 10 to 15 pounds may help improve blood  glucose levels. °SEEK MEDICAL CARE IF:  °· You have questions about medicine, activity, or diet. °· You continue to have symptoms (problems such as increased thirst, urination, or weight gain). °SEEK IMMEDIATE MEDICAL CARE IF:  °· You are vomiting or have diarrhea. °· Your breath smells fruity. °· You are breathing faster or slower. °· You are very sleepy or incoherent. °· You have numbness, tingling, or pain in your feet or hands. °· You have chest pain. °· Your symptoms get worse even though you have been following your caregiver's orders. °· If you have any other questions or concerns. °  °This information is not intended to replace advice given to you by your health care provider. Make sure you discuss any questions you have with your health care provider. °  °Document Released: 05/28/2001 Document Revised: 02/24/2012 Document Reviewed: 08/08/2015 °Elsevier Interactive Patient Education ©2016 [employer] ° °

## 2015-10-11 NOTE — ED Notes (Signed)
Pt sts donated plasma today and felt like "his blood was thick" and he also wanted to get an rx for his home meds for DM

## 2015-10-11 NOTE — ED Notes (Signed)
Pt is in stable condition upon d/c and ambulates from ED. 

## 2016-02-13 ENCOUNTER — Emergency Department (HOSPITAL_COMMUNITY): Payer: No Typology Code available for payment source

## 2016-02-13 ENCOUNTER — Encounter (HOSPITAL_COMMUNITY): Payer: Self-pay | Admitting: Emergency Medicine

## 2016-02-13 ENCOUNTER — Emergency Department (HOSPITAL_COMMUNITY)
Admission: EM | Admit: 2016-02-13 | Discharge: 2016-02-13 | Disposition: A | Discharge status: Home / Self Care | Payer: No Typology Code available for payment source | Attending: Emergency Medicine | Admitting: Emergency Medicine

## 2016-02-13 DIAGNOSIS — I10 Essential (primary) hypertension: Secondary | ICD-10-CM | POA: Insufficient documentation

## 2016-02-13 DIAGNOSIS — E119 Type 2 diabetes mellitus without complications: Secondary | ICD-10-CM | POA: Insufficient documentation

## 2016-02-13 DIAGNOSIS — Z79899 Other long term (current) drug therapy: Secondary | ICD-10-CM | POA: Insufficient documentation

## 2016-02-13 DIAGNOSIS — S9001XA Contusion of right ankle, initial encounter: Secondary | ICD-10-CM | POA: Insufficient documentation

## 2016-02-13 DIAGNOSIS — Z8659 Personal history of other mental and behavioral disorders: Secondary | ICD-10-CM | POA: Insufficient documentation

## 2016-02-13 DIAGNOSIS — Y998 Other external cause status: Secondary | ICD-10-CM | POA: Insufficient documentation

## 2016-02-13 DIAGNOSIS — M25571 Pain in right ankle and joints of right foot: Secondary | ICD-10-CM

## 2016-02-13 DIAGNOSIS — Y9289 Other specified places as the place of occurrence of the external cause: Secondary | ICD-10-CM | POA: Insufficient documentation

## 2016-02-13 DIAGNOSIS — Y9389 Activity, other specified: Secondary | ICD-10-CM | POA: Insufficient documentation

## 2016-02-13 DIAGNOSIS — Z7984 Long term (current) use of oral hypoglycemic drugs: Secondary | ICD-10-CM | POA: Insufficient documentation

## 2016-02-13 DIAGNOSIS — S93401A Sprain of unspecified ligament of right ankle, initial encounter: Secondary | ICD-10-CM | POA: Insufficient documentation

## 2016-02-13 DIAGNOSIS — F17210 Nicotine dependence, cigarettes, uncomplicated: Secondary | ICD-10-CM | POA: Insufficient documentation
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
## 2016-02-13 NOTE — Discharge Instructions (Signed)
Wear ankle brace for at least 2 weeks for stabilization of ankle. Use crutches as needed for comfort. Ice and elevate ankle throughout the day. Alternate between tylenol and motrin for pain relief. Call orthopedic follow up today or tomorrow to schedule followup appointment for recheck of ongoing ankle pain in 1-2 weeks that can be canceled with a 24-48 hour notice if complete resolution of pain. Return to the ER for changes or worsening symptoms.    Ankle Sprain An ankle sprain is an injury to the strong, fibrous tissues (ligaments) that hold your ankle bones together.  HOME CARE   Put ice on your ankle for 1-2 days or as told by your doctor.  Put ice in a plastic bag.  Place a towel between your skin and the bag.  Leave the ice on for 15-20 minutes at a time, every 2 hours while you are awake.  Only take medicine as told by your doctor.  Raise (elevate) your injured ankle above the level of your heart as much as possible for 2-3 days.  Use crutches if your doctor tells you to. Slowly put your own weight on the affected ankle. Use the crutches until you can walk without pain.  If you have a plaster splint:  Do not rest it on anything harder than a pillow for 24 hours.  Do not put weight on it.  Do not get it wet.  Take it off to shower or bathe.  If given, use an elastic wrap or support stocking for support. Take the wrap off if your toes lose feeling (numb), tingle, or turn cold or blue.  If you have an air splint:  Add or let out air to make it comfortable.  Take it off at night and to shower and bathe.  Wiggle your toes and move your ankle up and down often while you are wearing it. GET HELP IF:  You have rapidly increasing bruising or puffiness (swelling).  Your toes feel very cold.  You lose feeling in your foot.  Your medicine does not help your pain. GET HELP RIGHT AWAY IF:   Your toes lose feeling (numb) or turn blue.  You have severe pain that is  increasing. MAKE SURE YOU:   Understand these instructions.  Will watch your condition.  Will get help right away if you are not doing well or get worse.   This information is not intended to replace advice given to you by your health care provider. Make sure you discuss any questions you have with your health care provider.   Document Released: 05/20/2008 Document Revised: 12/23/2014 Document Reviewed: 06/15/2012 Elsevier Interactive Patient Education 2016 [employer] Inc[employer]  Contusion A contusion is a deep bruise. Contusions happen when an injury causes bleeding under the skin. Symptoms of bruising include pain, swelling, and discolored skin. The skin may turn blue, purple, or yellow. HOME CARE   Rest the injured area.  If told, put ice on the injured area.  Put ice in a plastic bag.  Place a towel between your skin and the bag.  Leave the ice on for 20 minutes, 2-3 times per day.  If told, put light pressure (compression) on the injured area using an elastic bandage. Make sure the bandage is not too tight. Remove it and put it back on as told by your doctor.  If possible, raise (elevate) the injured area above the level of your heart while you are sitting or lying down.  Take over-the-counter and prescription medicines  only as told by your doctor. GET HELP IF:  Your symptoms do not get better after several days of treatment.  Your symptoms get worse.  You have trouble moving the injured area. GET HELP RIGHT AWAY IF:   You have very bad pain.  You have a loss of feeling (numbness) in a hand or foot.  Your hand or foot turns pale or cold.   This information is not intended to replace advice given to you by your health care provider. Make sure you discuss any questions you have with your health care provider.   Document Released: 05/20/2008 Document Revised: 08/23/2015 Document Reviewed: 04/19/2015 Elsevier Interactive Patient Education 2016 Else[employer]  Inc[employer]  Cryotherapy Cryotherapy is when you put ice on your injury. Ice helps lessen pain and puffiness (swelling) after an injury. Ice works the best when you start using it in the first 24 to 48 hours after an injury. HOME CARE  Put a dry or damp towel between the ice pack and your skin.  You may press gently on the ice pack.  Leave the ice on for no more than 10 to 20 minutes at a time.  Check your skin after 5 minutes to make sure your skin is okay.  Rest at least 20 minutes between ice pack uses.  Stop using ice when your skin loses feeling (numbness).  Do not use ice on someone who cannot tell you when it hurts. This includes small children and people with memory problems (dementia). GET HELP RIGHT AWAY IF:  You have white spots on your skin.  Your skin turns blue or pale.  Your skin feels waxy or hard.  Your puffiness gets worse. MAKE SURE YOU:   Understand these instructions.  Will watch your condition.  Will get help right away if you are not doing well or get worse.   This information is not intended to replace advice given to you by your health care provider. Make sure you discuss any questions you have with your health care provider.   Document Released: 05/20/2008 Document Revised: 02/24/2012 Document Reviewed: 07/25/2011 Elsevier Interactive Patient Education [employer]

## 2016-07-01 ENCOUNTER — Emergency Department (HOSPITAL_COMMUNITY)
Admission: EM | Admit: 2016-07-01 | Discharge: 2016-07-01 | Disposition: A | Discharge status: Home / Self Care | Payer: Managed Care, Other (non HMO) | Attending: Emergency Medicine | Admitting: Emergency Medicine

## 2016-07-01 ENCOUNTER — Encounter (HOSPITAL_COMMUNITY): Payer: Self-pay | Admitting: Emergency Medicine

## 2016-07-01 DIAGNOSIS — Y939 Activity, unspecified: Secondary | ICD-10-CM | POA: Insufficient documentation

## 2016-07-01 DIAGNOSIS — Z7984 Long term (current) use of oral hypoglycemic drugs: Secondary | ICD-10-CM | POA: Insufficient documentation

## 2016-07-01 DIAGNOSIS — X58XXXA Exposure to other specified factors, initial encounter: Secondary | ICD-10-CM | POA: Diagnosis not present

## 2016-07-01 DIAGNOSIS — Y999 Unspecified external cause status: Secondary | ICD-10-CM | POA: Diagnosis not present

## 2016-07-01 DIAGNOSIS — S90821A Blister (nonthermal), right foot, initial encounter: Secondary | ICD-10-CM | POA: Insufficient documentation

## 2016-07-01 DIAGNOSIS — Y929 Unspecified place or not applicable: Secondary | ICD-10-CM | POA: Insufficient documentation

## 2016-07-01 DIAGNOSIS — F17210 Nicotine dependence, cigarettes, uncomplicated: Secondary | ICD-10-CM | POA: Diagnosis not present

## 2016-07-01 DIAGNOSIS — S99921A Unspecified injury of right foot, initial encounter: Secondary | ICD-10-CM | POA: Diagnosis present

## 2016-07-01 DIAGNOSIS — I10 Essential (primary) hypertension: Secondary | ICD-10-CM | POA: Insufficient documentation

## 2016-07-01 DIAGNOSIS — E119 Type 2 diabetes mellitus without complications: Secondary | ICD-10-CM | POA: Insufficient documentation

## 2016-07-01 DIAGNOSIS — Z79899 Other long term (current) drug therapy: Secondary | ICD-10-CM | POA: Insufficient documentation

## 2016-07-01 LAB — CBG MONITORING, ED: GLUCOSE-CAPILLARY: 90 mg/dL (ref 65–99)
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
## 2016-07-01 NOTE — ED Provider Notes (Signed)
CSN: [id number]     Arrival date & time 07/01/16  1753 History  By signing my name below, I, Johnathan Stafford, attest that this documentation has been prepared under the direction and in the presence of [address], [employer]. Electronically Signed: [name], ED Scribe. 07/01/2016. 7:42 PM.   Chief Complaint  Patient presents with  . Foot Injury   Patient is a 35 y.o. male presenting with foot injury. The history is provided by the patient. No language interpreter was used.  Foot Injury Location:  Foot Time since incident:  2 days Injury: no   Foot location:  L foot Pain details:    Quality:  Throbbing   Radiates to:  Does not radiate   Severity:  Moderate   Onset quality:  Gradual   Duration:  2 days   Timing:  Constant   Progression:  Unchanged Chronicity:  New Relieved by:  None tried Worsened by:  Bearing weight Ineffective treatments:  None tried Associated symptoms: tingling (intermittent, resolved)   Associated symptoms: no decreased ROM, no fever, no muscle weakness, no numbness and no swelling     HPI Comments: Johnathan Stafford is a 35 y.o. male with PMHx DM2, who presents to the Emergency Department complaining of gradual onset, intermittent, 6/10, throbbing, non-radiating, R plantar foot pain x 2 days. Pt reports that he was arrested 2 days ago and was barefoot during the arrest. He began having gradual pain while in jail and the next day he noticed a dark blood blister-like spot to the bottom of his foot. No injury or cuts to the foot. The pain is exacerbated with ambulation. He also complains of a tingling sensation to the entire left foot which is intermittent, and currently resolved. Denies puncture wound, skin opening, bleeding, weakness, numbness, fever, chills, chest pain, shortness of breath, abdominal pain, nausea, vomiting, diarrhea, constipation, dysuria, hematuria, or any other associated symptoms. Does not think he stepped on anything.   Past Medical History   Diagnosis Date  . ADHD (attention deficit hyperactivity disorder)     "all the way up to 9th grade"  . Numbness and tingling in right hand 07/21/2012    resolved at 2355 07/21/12  . Diabetes mellitus without complication (HCC)   . Hypertension    Past Surgical History  Procedure Laterality Date  . No past surgeries     Family History  Problem Relation Age of Onset  . Diabetes type II Mother   . Coronary artery disease Mother   . Hyperthyroidism Father    Social History  Substance Use Topics  . Smoking status: Current Every Day Smoker -- 1.00 packs/day for 13 years    Types: Cigarettes  . Smokeless tobacco: Never Used  . Alcohol Use: Yes     Comment: 07/21/12 "I do alcohol maybe twice a month; if that"    Review of Systems  Constitutional: Negative for fever and chills.  Respiratory: Negative for shortness of breath.   Cardiovascular: Negative for chest pain.  Gastrointestinal: Negative for nausea, vomiting, abdominal pain, diarrhea and constipation.  Genitourinary: Negative for dysuria and hematuria.  Musculoskeletal: Positive for arthralgias. Negative for joint swelling.  Skin: Positive for color change (blood blister). Negative for wound.  Allergic/Immunologic: Positive for immunocompromised state (diabetic).  Neurological: Negative for weakness and numbness.  Psychiatric/Behavioral: Negative for confusion.  A complete 10 system review of systems was obtained and all systems are negative except as noted in the HPI and PMH.   Allergies  Review of patient's  allergies indicates no known allergies.  Home Medications   Prior to Admission medications   Medication Sig Start Date End Date Taking? Authorizing Provider  cyclobenzaprine (FLEXERIL) 10 MG tablet Take 1 tablet (10 mg total) by mouth 2 (two) times daily as needed for muscle spasms. 12/26/12   Tiffany Neva SeatGreene, PA-C  ibuprofen (ADVIL,MOTRIN) 600 MG tablet Take 1 tablet (600 mg total) by mouth every 6 (six) hours as needed  for pain. 12/26/12   Tiffany Neva SeatGreene, PA-C  lisinopril (PRINIVIL,ZESTRIL) 10 MG tablet Take 1 tablet (10 mg total) by mouth daily. 10/11/15   Teressa LowerVrinda Pickering, NP  metFORMIN (GLUCOPHAGE) 1000 MG tablet Take 1 tablet (1,000 mg total) by mouth 2 (two) times daily with a meal. 10/11/15   Teressa LowerVrinda Pickering, NP  oxyCODONE-acetaminophen (PERCOCET/ROXICET) 5-325 MG per tablet Take 1 tablet by mouth every 6 (six) hours as needed for pain. 12/26/12   Tiffany Neva SeatGreene, PA-C   BP 136/100 mmHg  Pulse 104  Temp(Src) 98.4 F (36.9 C) (Oral)  Resp 16  Ht 6' (1.829 m)  Wt 195 lb (88.451 kg)  BMI 26.44 kg/m2  SpO2 99%   Physical Exam  Constitutional: He is oriented to person, place, and time. Vital signs are normal. He appears well-developed and well-nourished.  Non-toxic appearance. No distress.  Afebrile, nontoxic, NAD  HENT:  Head: Normocephalic and atraumatic.  Mouth/Throat: Mucous membranes are normal.  Eyes: Conjunctivae and EOM are normal. Right eye exhibits no discharge. Left eye exhibits no discharge.  Neck: Normal range of motion. Neck supple.  Cardiovascular: Normal rate and intact distal pulses.   Pulmonary/Chest: Effort normal. No respiratory distress.  Abdominal: Normal appearance. He exhibits no distension.  Musculoskeletal: Normal range of motion.  Right foot with FROM intact in all joints, mild TTP to a blood blister located on the plantar aspect of the foot, skin intact, no bleeding, no warmth or red streaking, strength and sensation grossly intact, distal pulses intact, compartments soft.  Neurological: He is alert and oriented to person, place, and time. He has normal strength. No sensory deficit.  Skin: Skin is warm, dry and intact. No rash noted.  Psychiatric: He has a normal mood and affect.  Nursing note and vitals reviewed.   ED Course  Procedures (including critical care time)  DIAGNOSTIC STUDIES: Oxygen Saturation is 99% on RA, normal by my interpretation.    COORDINATION  OF CARE: 7:28 PM-Discussed treatment plan which includes F/U with PCP with pt at bedside and pt agreed to plan.   Labs Review Labs Reviewed  CBG MONITORING, ED    Imaging Review No results found. I have personally reviewed and evaluated these images and lab results as part of my medical decision-making.   EKG Interpretation None      MDM   Final diagnoses:  Blister of foot, right, initial encounter    35 y.o. male here with blood blister to R foot, skin intact without evidence of puncture. Likely just blood blister. Doubt need for imaging or exploration, as this is a sterile environment and has no evidence of infection, do not want to introduce possible infectious material into the area. NVI with soft compartments. Discussed RICE, and care of skin to avoid skin opening. F/up with PCP in 3-5 days for recheck of area. Tylenol/motrin for pain.  I explained the diagnosis and have given explicit precautions to return to the ER including for any other new or worsening symptoms. The patient understands and accepts the medical plan as it's been dictated and  I have answered their questions. Discharge instructions concerning home care and prescriptions have been given. The patient is STABLE and is discharged to home in good condition.   I personally performed the services described in this documentation, which was scribed in my presence. The recorded information has been reviewed and is accurate.  BP 136/100 mmHg  Pulse 104  Temp(Src) 98.4 F (36.9 C) (Oral)  Resp 16  Ht 6' (1.829 m)  Wt 88.451 kg  BMI 26.44 kg/m2  SpO2 99%  No orders of the defined types were placed in this encounter.        France Ravens Camprubi-Soms, PA-C 07/01/16 1950  Lavera Guise, MD 07/02/16 [phone]

## 2016-07-01 NOTE — Discharge Instructions (Signed)
Keep area clean and dry. Apply ice or warm compresses to affected area throughout the day. Take tylenol or motrin as needed for pain. [facility]-5 days for wound recheck. Monitor area for signs of infection to include, but not limited to: increasing pain, spreading redness, drainage/pus, worsening swelling, or fevers. Return to emergency department for emergent changing or worsening symptoms.    Blisters A blister is a fluid-filled sac that forms between layers of skin. Blisters often form in areas where skin rubs against other skin or rubs against something else. The most common areas for blisters are the hands and feet. CAUSES A blister can be caused by:  An injury.  A burn.  An allergic reaction.  An infection.  Exposure to irritating chemicals.  Friction. Friction blisters often result from:  Sports.  Repetitive activities.  Shoes that are too tight or too loose. SIGNS AND SYMPTOMS A blister is often round and looks like a bump. It may itch or be painful to the touch. The liquid in a blister is clear or bloody. Before a blister forms, the skin may become red, feel warm, itch, or be painful to the touch. DIAGNOSIS A blister can usually be diagnosed from its appearance. TREATMENT Treatment involves protecting the area where the blister has formed until the skin has healed. If something is likely to rub against the blister, apply a bandage (dressing) with a hole in the middle over the blister. Most blisters break open, dry up, and go away on their own within 10 days. Rarely, blisters that are very painful may be drained before they break open on their own. Draining of a blister should only be done by a health care provider under sterile conditions. HOME CARE INSTRUCTIONS  Protect the area where the blister has formed as directed by your health care provider.  Do not open or pop your blister, because it could become  infected.  If the blister is very painful, ask your health care provider whether you should have it drained.  If the blister breaks open on its own:  Do not remove the loose skin that is over the blister.  Wash the blister area with soap and water every day.  After washing the blister area, you may apply an antibiotic cream or ointment and cover the area with a bandage. PREVENTION Taking these steps can help to prevent blisters that are caused by friction:  Wear comfortable shoes that fit well.  Always wear socks with shoes.  Wear extra socks or use tape, bandages, or pads over blister-prone areas as needed.  Wear protective gear, such as gloves, when participating in sports or activities that can cause blisters.  Use powders as needed to keep your feet dry. SEEK MEDICAL CARE IF:  You have increased redness, swelling, or pain in the blister area.  A puslike discharge is coming from the blister area.  You have a fever.  You have chills.   This information is not intended to replace advice given to you by your health care provider. Make sure you discuss any questions you have with your health care provider.   Document Released: 01/09/2005 Document Revised: 12/23/2014 Document Reviewed: 07/02/2014 Elsevier Interactive Patient Education [employer]2016 Elsevier Inc.

## 2016-07-03 ENCOUNTER — Encounter (HOSPITAL_COMMUNITY): Payer: Self-pay | Admitting: Emergency Medicine

## 2016-07-03 ENCOUNTER — Ambulatory Visit (HOSPITAL_COMMUNITY)
Admission: EM | Admit: 2016-07-03 | Discharge: 2016-07-03 | Disposition: A | Discharge status: Home / Self Care | Payer: Managed Care, Other (non HMO) | Attending: Emergency Medicine | Admitting: Emergency Medicine

## 2016-07-03 DIAGNOSIS — S9031XA Contusion of right foot, initial encounter: Secondary | ICD-10-CM | POA: Diagnosis not present

## 2016-07-03 NOTE — ED Notes (Addendum)
Small hematoma/blood blister to bottom of right foot.  Reports incident occurred over the weekend

## 2016-07-03 NOTE — ED Provider Notes (Signed)
CSN: [id number]     Arrival date & time 07/03/16  1026 History   First MD Initiated Contact with Patient 07/03/16 1147     Chief Complaint  Patient presents with  . Foot Pain   (Consider location/radiation/quality/duration/timing/severity/associated sxs/prior Treatment) HPI Comments: Patient presents for follow-up to an injury from 06/29/16. He may have "stepped" on something during arrest. He was seen at the ER on 07/01/16 with Right foot pain and dark area on bottom of foot. He was dx with a small hematoma "blood blister" on the bottom of his foot and told him it should slowly resolve on own. Did not want to open it or drain it since he is a diabetic and may lead to additional problems such as infection. Patient here today for recheck. Has been improving slowly but unable to stand at work and needs note for work. Has not been taking anything for pain.   Patient is a 35 y.o. male presenting with lower extremity pain. The history is provided by the patient.  Foot Pain    Past Medical History  Diagnosis Date  . ADHD (attention deficit hyperactivity disorder)     "all the way up to 9th grade"  . Numbness and tingling in right hand 07/21/2012    resolved at 2355 07/21/12  . Diabetes mellitus without complication (HCC)   . Hypertension    Past Surgical History  Procedure Laterality Date  . No past surgeries     Family History  Problem Relation Age of Onset  . Diabetes type II Mother   . Coronary artery disease Mother   . Hyperthyroidism Father    Social History  Substance Use Topics  . Smoking status: Current Every Day Smoker -- 1.00 packs/day for 13 years    Types: Cigarettes  . Smokeless tobacco: Never Used  . Alcohol Use: Yes     Comment: 07/21/12 "I do alcohol maybe twice a month; if that"    Review of Systems  Constitutional: Negative for fever.  Skin: Positive for color change and wound.  Neurological: Negative for weakness and numbness.    Allergies  Review of patient's  allergies indicates no known allergies.  Home Medications   Prior to Admission medications   Medication Sig Start Date End Date Taking? Authorizing Provider  metFORMIN (GLUCOPHAGE) 1000 MG tablet Take 1 tablet (1,000 mg total) by mouth 2 (two) times daily with a meal. 10/11/15  Yes Teressa LowerVrinda Pickering, NP  cyclobenzaprine (FLEXERIL) 10 MG tablet Take 1 tablet (10 mg total) by mouth 2 (two) times daily as needed for muscle spasms. 12/26/12   Tiffany Neva SeatGreene, PA-C  ibuprofen (ADVIL,MOTRIN) 600 MG tablet Take 1 tablet (600 mg total) by mouth every 6 (six) hours as needed for pain. 12/26/12   Tiffany Neva SeatGreene, PA-C  lisinopril (PRINIVIL,ZESTRIL) 10 MG tablet Take 1 tablet (10 mg total) by mouth daily. 10/11/15   Teressa LowerVrinda Pickering, NP  oxyCODONE-acetaminophen (PERCOCET/ROXICET) 5-325 MG per tablet Take 1 tablet by mouth every 6 (six) hours as needed for pain. 12/26/12   Marlon Peliffany Greene, PA-C   Meds Ordered and Administered this Visit  Medications - No data to display  BP 139/92 mmHg  Pulse 82  Temp(Src) 98.5 F (36.9 C) (Oral)  Resp 16  SpO2 100% No data found.   Physical Exam  Constitutional: He is oriented to person, place, and time. He appears well-developed and well-nourished.  Musculoskeletal: Normal range of motion.       Right foot: There is normal range of motion,  no swelling and normal capillary refill.       Feet:  Small, about 2cm oval dark brown hematoma with slightly reddish center present on medial palmar area of right foot. Tender on palpation. Has full range of motion of foot and Neuro is intact.   Neurological: He is alert and oriented to person, place, and time.  Skin: Skin is warm, dry and intact.    ED Course  Procedures (including critical care time)  Labs Review Labs Reviewed - No data to display  Imaging Review No results found.   Visual Acuity Review  Right Eye Distance:   Left Eye Distance:   Bilateral Distance:    Right Eye Near:   Left Eye Near:      Bilateral Near:         MDM   1. Traumatic hematoma of foot, right, initial encounter    Reviewed with patient that small hematoma is slowly resolving and improving. Reviewed that since it is healing on own, opening area to drain blood would not be necessary at this time. It may induce more issues such as infection and additional pain, especially since patient is diabetic. Recommend continue to limit pressure to bottom of foot. Note written for patient to be out of work for next 3 days since he stands and walks continuously for his job. May take OTC Tylenol  every 8 hours as needed for pain. Follow-up with his PCP next week if not resolving or with a Podiatrist for further evaluation.     Sudie Grumbling, NP 07/03/16 [phone]

## 2016-07-03 NOTE — Discharge Instructions (Signed)
The contusion and "blood blister" on the bottom of the foot is slowly healing and resolving. Continue to limit pressure on bottom of foot. Follow-up with your PCP or Podiatrist for further evaluation if symptoms do not resolve.   Foot Contusion A foot contusion is a deep bruise to the foot. Contusions are the result of an injury that caused bleeding under the skin. The contusion may turn blue, purple, or yellow. Minor injuries will give you a painless contusion, but more severe contusions may stay painful and swollen for a few weeks. CAUSES  A foot contusion comes from a direct blow to that area, such as a heavy object falling on the foot. SYMPTOMS   Swelling of the foot.  Discoloration of the foot.  Tenderness or soreness of the foot. DIAGNOSIS  You will have a physical exam and will be asked about your history. You may need an X-ray of your foot to look for a broken bone (fracture).  TREATMENT  An elastic wrap may be recommended to support your foot. Resting, elevating, and applying cold compresses to your foot are often the best treatments for a foot contusion. Over-the-counter medicines may also be recommended for pain control. HOME CARE INSTRUCTIONS   Put ice on the injured area.  Put ice in a plastic bag.  Place a towel between your skin and the bag.  Leave the ice on for 15-20 minutes, 03-04 times a day.  Only take over-the-counter or prescription medicines for pain, discomfort, or fever as directed by your caregiver.  If told, use an elastic wrap as directed. This can help reduce swelling. You may remove the wrap for sleeping, showering, and bathing. If your toes become numb, cold, or blue, take the wrap off and reapply it more loosely.  Elevate your foot with pillows to reduce swelling.  Try to avoid standing or walking while the foot is painful. Do not resume use until instructed by your caregiver. Then, begin use gradually. If pain develops, decrease use. Gradually  increase activities that do not cause discomfort until you have normal use of your foot.  See your caregiver as directed. It is very important to keep all follow-up appointments in order to avoid any lasting problems with your foot, including long-term (chronic) pain. SEEK IMMEDIATE MEDICAL CARE IF:   You have increased redness, swelling, or pain in your foot.  Your swelling or pain is not relieved with medicines.  You have loss of feeling in your foot or are unable to move your toes.  Your foot turns cold or blue.  You have pain when you move your toes.  Your foot becomes warm to the touch.  Your contusion does not improve in 2 days. MAKE SURE YOU:   Understand these instructions.  Will watch your condition.  Will get help right away if you are not doing well or get worse.   This information is not intended to replace advice given to you by your health care provider. Make sure you discuss any questions you have with your health care provider.   Document Released: 09/23/2006 Document Revised: 06/02/2012 Document Reviewed: 08/08/2015 Elsevier Interactive Patient Education [employer]2016 Elsevier Inc.

## 2016-07-11 ENCOUNTER — Ambulatory Visit (INDEPENDENT_AMBULATORY_CARE_PROVIDER_SITE_OTHER): Payer: Managed Care, Other (non HMO) | Admitting: Podiatry

## 2016-07-11 ENCOUNTER — Encounter: Payer: Self-pay | Admitting: Podiatry

## 2016-07-11 VITALS — BP 141/95 | HR 81 | Resp 14

## 2016-07-11 DIAGNOSIS — T148 Other injury of unspecified body region: Secondary | ICD-10-CM | POA: Diagnosis not present

## 2016-07-11 DIAGNOSIS — T148XXA Other injury of unspecified body region, initial encounter: Secondary | ICD-10-CM

## 2016-07-11 NOTE — Progress Notes (Signed)
   Subjective:    Patient ID: Johnathan Stafford, male    DOB: [birth year], 35 y.o.   MRN: [id number]  HPI this diabetic patient presents the office with concerns over a black area under the ball of his right foot. He says that it occurred Saturday night when he was arrested. He says he is not aware what happened but that he was barefoot and then noted that the blackened area had occurred at that time. He was seen at the emergency care and they diagnosed a hematoma and recommended that the watch his progress. He then returned and there was no change and they recommended that he be evaluated by a podiatrist. He says the area is a blackened area of skin, but he works 10 hours a day with no pain or discomfort at this site. He presents the office for an evaluation and treatment of this condition  GENERAL APPEARANCE: Alert, conversant. Appropriately groomed. No acute distress.  VASCULAR: Pedal pulses are  palpable at  [facility] and PT bilateral.  Capillary refill time is immediate to all digits,  Normal temperature gradient.  Digital hair growth is present bilateral  NEUROLOGIC: sensation is normal to 5.07 monofilament at 5/5 sites bilateral.  Light touch is intact bilateral, Muscle strength normal.  MUSCULOSKELETAL: acceptable muscle strength, tone and stability bilateral.  Intrinsic muscluature intact bilateral.  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  No preulcerative lesions or ulcers  are seen, no interdigital maceration noted.  No open lesions present.  Digital nails are asymptomatic. No drainage noted. There is blackened area of skin under his 1st MPJ right foot.  No redness swelling or drainage or fluctuance.                       Review of Systems  All other systems reviewed and are negative.      Objective:   Physical Exam        Assessment & Plan:  Hematoma/ Skin necrosis right foot.    IE  Discussed condition  with patient.  Explained this will self heal in 4 weeks.  RTC prn   Helane Gunther DPM

## 2017-03-14 IMAGING — DX DG ANKLE COMPLETE 3+V*R*
3 series · 3 of 3 positions shown · non-contrast
Comparison: None.

CLINICAL DATA: 34-year-old male with trauma and right ankle pain

EXAM:
RIGHT ANKLE - COMPLETE 3+ VIEW

[ankle ap]
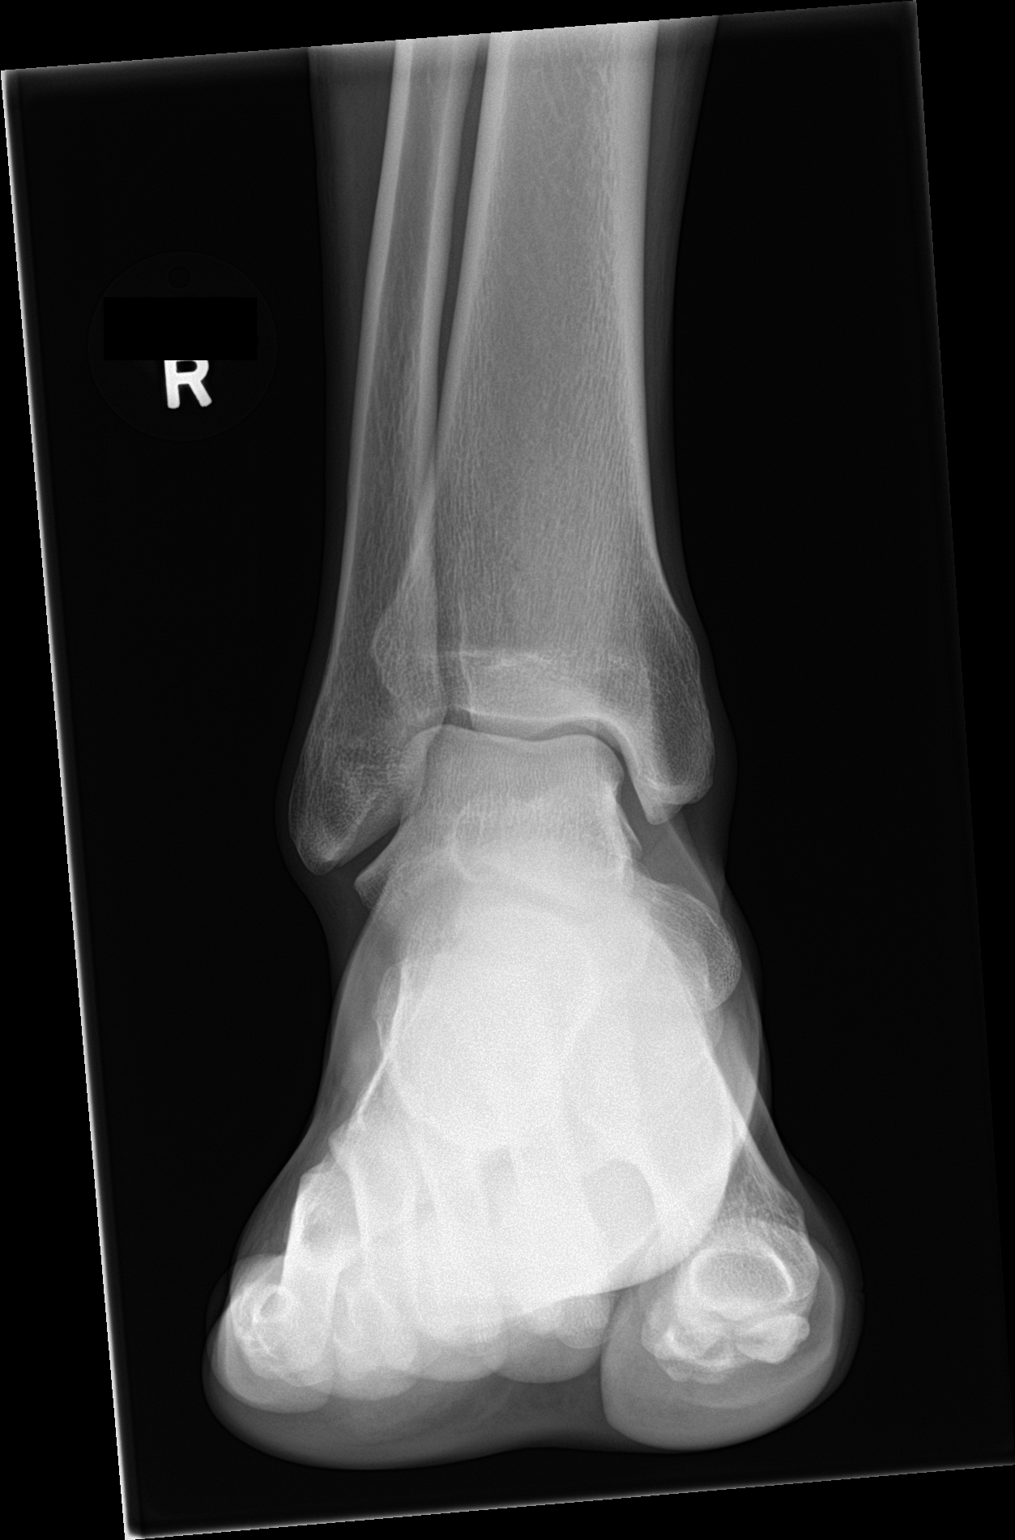

[ankle obl]
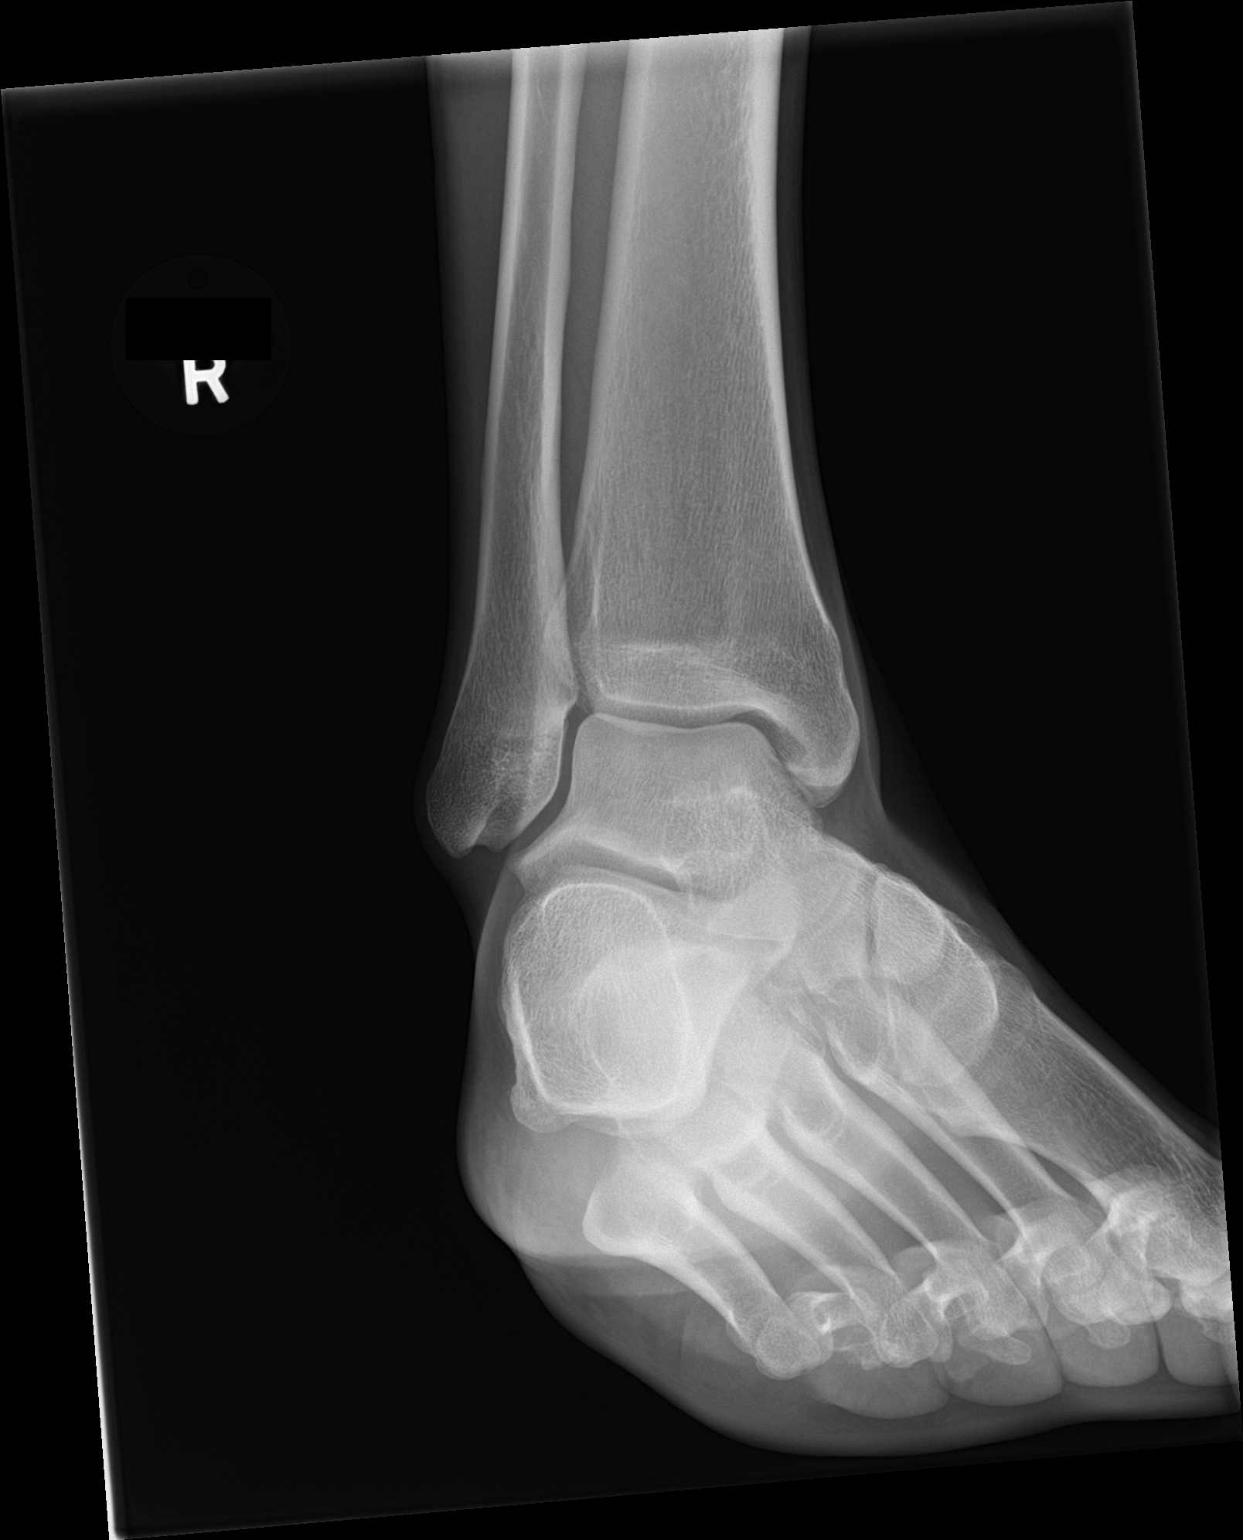

[ankle lat]
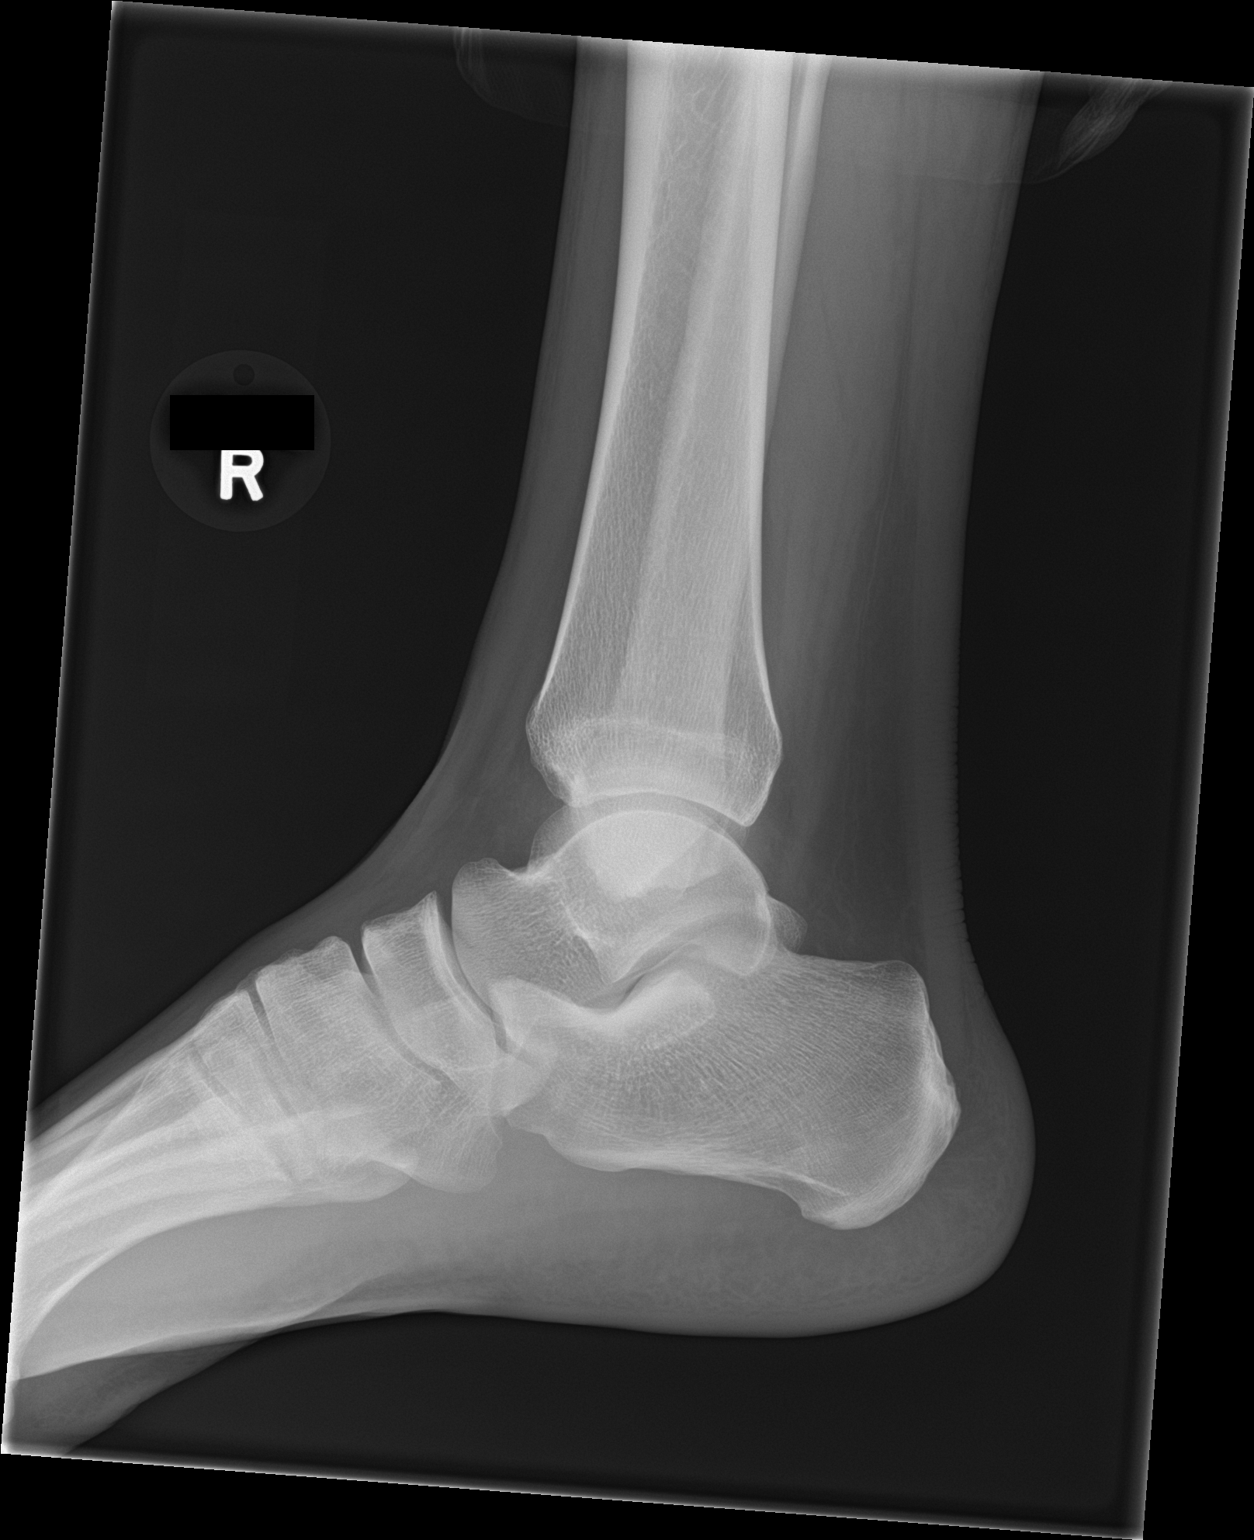

[3 of 3 positions shown; findings below may reference images not displayed]

FINDINGS: There is no evidence of fracture, dislocation, or joint effusion.
There is no evidence of arthropathy or other focal bone abnormality.
Soft tissues are unremarkable.
IMPRESSION: Negative.
